# Patient Record
Sex: Male | Born: 1975 | Hispanic: No | Marital: Married | Smoking: Current some day smoker
Health system: Southern US, Community
[De-identification: ages and names within clinical notes are randomized; demographics above are authoritative.]

## PROBLEM LIST (undated history)

## (undated) DIAGNOSIS — K219 Gastro-esophageal reflux disease without esophagitis: Secondary | ICD-10-CM

## (undated) DIAGNOSIS — F419 Anxiety disorder, unspecified: Secondary | ICD-10-CM

## (undated) HISTORY — DX: Gastro-esophageal reflux disease without esophagitis: K21.9

## (undated) HISTORY — DX: Anxiety disorder, unspecified: F41.9

---

## 2008-06-19 ENCOUNTER — Emergency Department (HOSPITAL_COMMUNITY): Admission: EM | Admit: 2008-06-19 | Discharge: 2008-06-20 | Payer: Self-pay | Admitting: Emergency Medicine

## 2008-08-27 ENCOUNTER — Encounter: Admission: RE | Admit: 2008-08-27 | Discharge: 2008-08-27 | Payer: Self-pay | Admitting: Emergency Medicine

## 2009-04-08 ENCOUNTER — Emergency Department (HOSPITAL_COMMUNITY): Admission: EM | Admit: 2009-04-08 | Discharge: 2009-04-09 | Payer: Self-pay | Admitting: Emergency Medicine

## 2009-10-30 ENCOUNTER — Ambulatory Visit (HOSPITAL_COMMUNITY): Admission: RE | Admit: 2009-10-30 | Discharge: 2009-10-30 | Payer: Self-pay | Admitting: Emergency Medicine

## 2010-07-24 LAB — DIFFERENTIAL
Basophils Absolute: 0 10*3/uL (ref 0.0–0.1)
Basophils Relative: 1 % (ref 0–1)
Eosinophils Relative: 1 % (ref 0–5)
Lymphs Abs: 1.4 10*3/uL (ref 0.7–4.0)
Neutrophils Relative %: 69 % (ref 43–77)

## 2010-07-24 LAB — CBC
MCHC: 35.4 g/dL (ref 30.0–36.0)
Platelets: 174 10*3/uL (ref 150–400)
RBC: 4.42 MIL/uL (ref 4.22–5.81)
RDW: 12.4 % (ref 11.5–15.5)

## 2010-07-24 LAB — LIPASE, BLOOD: Lipase: 24 U/L (ref 11–59)

## 2010-07-24 LAB — COMPREHENSIVE METABOLIC PANEL
ALT: 43 U/L (ref 0–53)
AST: 31 U/L (ref 0–37)
Alkaline Phosphatase: 62 U/L (ref 39–117)
BUN: 11 mg/dL (ref 6–23)
CO2: 25 mEq/L (ref 19–32)
Calcium: 9 mg/dL (ref 8.4–10.5)
Potassium: 3.4 mEq/L — ABNORMAL LOW (ref 3.5–5.1)

## 2010-07-24 LAB — HEMOCCULT GUIAC POC 1CARD (OFFICE): Fecal Occult Bld: NEGATIVE

## 2010-07-24 LAB — H. PYLORI ANTIBODY, IGG: H Pylori IgG: 0.7 {ISR}

## 2010-08-08 LAB — POCT I-STAT, CHEM 8
BUN: 7 mg/dL (ref 6–23)
Calcium, Ion: 1.13 mmol/L (ref 1.12–1.32)
Chloride: 106 mEq/L (ref 96–112)
Creatinine, Ser: 0.8 mg/dL (ref 0.4–1.5)
Glucose, Bld: 94 mg/dL (ref 70–99)
Potassium: 3.7 mEq/L (ref 3.5–5.1)

## 2010-08-08 LAB — POCT CARDIAC MARKERS: Troponin i, poc: <0.05 ng/mL (ref 0.00–0.09)

## 2010-08-08 LAB — RAPID URINE DRUG SCREEN, HOSP PERFORMED
Amphetamines: NOT DETECTED
Barbiturates: NOT DETECTED
Opiates: NOT DETECTED

## 2011-05-27 ENCOUNTER — Ambulatory Visit (INDEPENDENT_AMBULATORY_CARE_PROVIDER_SITE_OTHER): Payer: 59 | Admitting: Emergency Medicine

## 2011-05-27 VITALS — BP 104/65 | HR 62 | Temp 98.4°F | Resp 16 | Ht 67.5 in | Wt 171.0 lb

## 2011-05-27 DIAGNOSIS — F411 Generalized anxiety disorder: Secondary | ICD-10-CM

## 2011-05-27 DIAGNOSIS — F419 Anxiety disorder, unspecified: Secondary | ICD-10-CM

## 2011-05-27 DIAGNOSIS — F32A Depression, unspecified: Secondary | ICD-10-CM

## 2011-05-27 DIAGNOSIS — F329 Major depressive disorder, single episode, unspecified: Secondary | ICD-10-CM

## 2011-05-27 NOTE — Progress Notes (Signed)
  Subjective:    Patient ID: Larry Waters, male    DOB: [birth year], 36 y.o.   MRN: [id number]  HPI patient comes to have completion of his FMLA forms. He misses about one day per week to 2 anxiety depression. He overall is doing well his wife is expecting their first child. He is comfortable with the medications he is on and doing well    Review of Systems    review of systems essentially noncontributory. He still suffers from anxiety depression but medication seemed to help a lot to make him more functional at work. Objective:   Physical Exam HEENT exam is unremarkable chest is clear heart regular rate and rhythm        Assessment & Plan:  Chronic anxiety and depression currently well controlled with medication

## 2011-07-31 ENCOUNTER — Ambulatory Visit (INDEPENDENT_AMBULATORY_CARE_PROVIDER_SITE_OTHER): Payer: 59 | Admitting: Family Medicine

## 2011-07-31 VITALS — BP 109/70 | HR 64 | Temp 98.2°F | Resp 18 | Ht 67.5 in | Wt 170.0 lb

## 2011-07-31 DIAGNOSIS — J069 Acute upper respiratory infection, unspecified: Secondary | ICD-10-CM

## 2011-07-31 DIAGNOSIS — F341 Dysthymic disorder: Secondary | ICD-10-CM

## 2011-07-31 DIAGNOSIS — F419 Anxiety disorder, unspecified: Secondary | ICD-10-CM

## 2011-07-31 MED ORDER — CLONAZEPAM 0.5 MG PO TABS: 0.5000 mg | ORAL_TABLET | ORAL | Status: DC | PRN

## 2011-07-31 MED ORDER — FLUTICASONE PROPIONATE 50 MCG/ACT NA SUSP: 2.0000 | Freq: Every day | NASAL | Status: DC

## 2011-07-31 MED ORDER — CITALOPRAM HYDROBROMIDE 40 MG PO TABS: 40.0000 mg | ORAL_TABLET | Freq: Every day | ORAL | Status: DC

## 2011-07-31 NOTE — Patient Instructions (Signed)
Drink plenty of fluids and get enough rest.  Take over the counter Claritin D once daily to help head congestion  For body aches take acetominophen (tylenol) 500 mg 2 pills 3 time in 24 hrs. If needed

## 2011-07-31 NOTE — Progress Notes (Signed)
Subjective: Patient has been feeling ill since about Friday. He has had facial discomfort and sinus congestion with clear rhinorrhea. He has not had a sore throat. He has had a headache and general bodyaches. He is not coughing. No nausea or vomiting or urinary complaints. He was able to go to work yesterday but just doesn't feel good. No one else in the home is ill.  Patient is not currently taking his medications because his prescriptions are out. He needs a new prescription for the citalopram and clonazepam. The medications do help him considerably when he takes them.  Objective: [place] male in no acute distress. TMs normal. Throat clear. Nose congested. Neck supple without significant nodes. Chest clear. Heart regular without murmurs. Soft without masses or tenderness.  . Assessment:  URI Anxiety and depression  Pain: Claritin-D OTC Flonase 2 sprays twice a day for 3 days then daily Refill citalopram and clonazepam Return in about 3-4 months.

## 2011-08-14 ENCOUNTER — Ambulatory Visit: Payer: 59

## 2011-08-14 ENCOUNTER — Ambulatory Visit (INDEPENDENT_AMBULATORY_CARE_PROVIDER_SITE_OTHER): Payer: 59 | Admitting: Family Medicine

## 2011-08-14 VITALS — BP 120/77 | HR 73 | Temp 98.1°F | Resp 18 | Ht 68.0 in | Wt 171.0 lb

## 2011-08-14 DIAGNOSIS — M25569 Pain in unspecified knee: Secondary | ICD-10-CM

## 2011-08-14 DIAGNOSIS — M542 Cervicalgia: Secondary | ICD-10-CM

## 2011-08-14 DIAGNOSIS — M79609 Pain in unspecified limb: Secondary | ICD-10-CM

## 2011-08-14 DIAGNOSIS — T148XXA Other injury of unspecified body region, initial encounter: Secondary | ICD-10-CM

## 2011-08-14 DIAGNOSIS — M62838 Other muscle spasm: Secondary | ICD-10-CM

## 2011-08-14 MED ORDER — NAPROXEN 500 MG PO TABS: 500.0000 mg | ORAL_TABLET | Freq: Two times a day (BID) | ORAL | Status: AC

## 2011-08-14 MED ORDER — CYCLOBENZAPRINE HCL 5 MG PO TABS: 5.0000 mg | ORAL_TABLET | Freq: Two times a day (BID) | ORAL | Status: AC | PRN
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
## 2011-12-11 ENCOUNTER — Ambulatory Visit (INDEPENDENT_AMBULATORY_CARE_PROVIDER_SITE_OTHER): Payer: 59 | Admitting: Family Medicine

## 2011-12-11 VITALS — BP 97/59 | HR 63 | Temp 98.4°F | Resp 14 | Ht 68.5 in | Wt 166.0 lb

## 2011-12-11 DIAGNOSIS — R590 Localized enlarged lymph nodes: Secondary | ICD-10-CM

## 2011-12-11 DIAGNOSIS — H9209 Otalgia, unspecified ear: Secondary | ICD-10-CM

## 2011-12-11 DIAGNOSIS — B349 Viral infection, unspecified: Secondary | ICD-10-CM

## 2011-12-11 DIAGNOSIS — H9203 Otalgia, bilateral: Secondary | ICD-10-CM

## 2011-12-11 DIAGNOSIS — R599 Enlarged lymph nodes, unspecified: Secondary | ICD-10-CM

## 2011-12-11 LAB — POCT CBC
Granulocyte percent: 57.7 %G (ref 37–80)
Hemoglobin: 15.6 g/dL (ref 14.1–18.1)
MCHC: 32 g/dL (ref 31.8–35.4)
MCV: 97.1 fL — AB (ref 80–97)
POC Granulocyte: 4.4 (ref 2–6.9)
POC LYMPH PERCENT: 34.3 %L (ref 10–50)
POC MID %: 8 %M (ref 0–12)
RBC: 5.03 M/uL (ref 4.69–6.13)
WBC: 7.7 10*3/uL (ref 4.6–10.2)

## 2011-12-11 MED ORDER — PREDNISONE 20 MG PO TABS: ORAL_TABLET | ORAL | Status: AC

## 2011-12-11 NOTE — Patient Instructions (Addendum)
Take the pills one tablet twice daily for 3 days with food  Return if worse

## 2011-12-11 NOTE — Progress Notes (Signed)
Subjective: Patient has had several days of pain in his years and in his throat. He actually points to the sides of his neck in the submandibular areas are most of the pain is coming from. 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 They feel like this may cause some problems.  His wife serves as Equities trader.  Objective: TMs are normal. Throat was clear. Neck supple without thyromegaly. Actually maybe there are minimally enlarged nodes under the angle of the jaw, but certainly is tender right along they're both sides. Chest is clear. Heart regular without murmurs.  Assessment: Lymphadenopathy Otalgia  Plan: CBC Results for orders placed in visit on 12/11/11  POCT CBC      Component Value Range   WBC 7.7  4.6 - 10.2 K/uL   Lymph, poc 2.6  0.6 - 3.4   POC LYMPH PERCENT 34.3  10 - 50 %L   MID (cbc) 0.6  0 - 0.9   POC MID % 8.0  0 - 12 %M   POC Granulocyte 4.4  2 - 6.9   Granulocyte percent 57.7  37 - 80 %G   RBC 5.03  4.69 - 6.13 M/uL   Hemoglobin 15.6  14.1 - 18.1 g/dL   HCT, POC 45.4  09.8 - 53.7 %   MCV 97.1 (*) 80 - 97 fL   MCH, POC 31.0  27 - 31.2 pg   MCHC 32.0  31.8 - 35.4 g/dL   RDW, POC 11.9     Platelet Count, POC 237  142 - 424 K/uL   MPV 8.9  0 - 99.8 fL   With this being normal will probably just treat symptomatically. If he continues to have problems I may empirically give a round of antibiotics, but is more likely garlic this time.

## 2012-05-19 ENCOUNTER — Ambulatory Visit: Payer: 59 | Admitting: Family Medicine

## 2012-05-19 VITALS — BP 110/73 | HR 78 | Temp 97.7°F | Resp 16 | Ht 67.8 in | Wt 167.4 lb

## 2012-05-19 DIAGNOSIS — F341 Dysthymic disorder: Secondary | ICD-10-CM

## 2012-05-19 DIAGNOSIS — F419 Anxiety disorder, unspecified: Secondary | ICD-10-CM

## 2012-05-19 DIAGNOSIS — M542 Cervicalgia: Secondary | ICD-10-CM

## 2012-05-19 DIAGNOSIS — F329 Major depressive disorder, single episode, unspecified: Secondary | ICD-10-CM

## 2012-05-19 MED ORDER — METHOCARBAMOL 500 MG PO TABS: ORAL_TABLET | ORAL | Status: DC

## 2012-05-19 MED ORDER — CLONAZEPAM 0.5 MG PO TABS: ORAL_TABLET | ORAL | Status: DC

## 2012-05-19 MED ORDER — CITALOPRAM HYDROBROMIDE 20 MG PO TABS: 30.0000 mg | ORAL_TABLET | Freq: Every day | ORAL | Status: DC

## 2012-05-19 NOTE — Patient Instructions (Signed)
Take muscle relaxants for your neck as directed.  The citalopram is a lower dose than previously. Take 1-1/2 pills every day for anxiety on a regular basis.  The clonazepam should only be used when needed for bad anxiety

## 2012-05-19 NOTE — Progress Notes (Signed)
Subjective: Patient been having problems with pain in his neck and he wants something for that. No injury. It's been hurting him for a week to. He is other problem is his chronic anxiety. It contained much same. He thinks he maybe could do are little lower dose of Celexa. His wife is with him. He needs her to translate. He works at [employer], has to pack and lift heavy boxes a lot. He needs FMLA paperwork filled out again. He misses work a day here a day Advair, sometimes several times a month, for his extreme anxiety.  Objective:  alert and oriented has full range of motion. Chest clear. Heart regular without murmurs. And soft without mass or tenderness. Motor strength is good. Neurovascular palms is good.  Assessment: Right neck pain Anxiety and depression, chronic  Plan: Filled out FMLA paperwork for him and his wife f says she has to come out whenever he is off.  Decrease his Celexa to 30 mg. 1-1/2x20 mg. Used the clonazepam on a when necessary basis, a needed. Return in 6 months.  Prescribe muscle relaxant for his neck. Did not give him NSAIDs because he has stomach issues.

## 2012-07-24 ENCOUNTER — Ambulatory Visit (INDEPENDENT_AMBULATORY_CARE_PROVIDER_SITE_OTHER): Payer: 59 | Admitting: Emergency Medicine

## 2012-07-24 VITALS — BP 118/76 | HR 68 | Temp 98.0°F | Resp 16 | Ht 67.75 in | Wt 167.0 lb

## 2012-07-24 DIAGNOSIS — R300 Dysuria: Secondary | ICD-10-CM

## 2012-07-24 DIAGNOSIS — R109 Unspecified abdominal pain: Secondary | ICD-10-CM

## 2012-07-24 LAB — POCT URINALYSIS DIPSTICK
Blood, UA: NEGATIVE
Glucose, UA: NEGATIVE
Spec Grav, UA: 1.025
pH, UA: 6

## 2012-07-24 LAB — POCT CBC
HCT, POC: 47.3 % (ref 43.5–53.7)
Hemoglobin: 15.7 g/dL (ref 14.1–18.1)
MCH, POC: 31.5 pg — AB (ref 27–31.2)
MCV: 95 fL (ref 80–97)
MID (cbc): 0.6 (ref 0–0.9)
Platelet Count, POC: 216 10*3/uL (ref 142–424)
RBC: 4.98 M/uL (ref 4.69–6.13)
WBC: 8.3 10*3/uL (ref 4.6–10.2)

## 2012-07-24 LAB — POCT UA - MICROSCOPIC ONLY
Epithelial cells, urine per micros: NEGATIVE
RBC, urine, microscopic: NEGATIVE

## 2012-07-24 MED ORDER — CEPHALEXIN 500 MG PO TABS: 500.0000 mg | ORAL_TABLET | Freq: Three times a day (TID) | ORAL | Status: DC

## 2012-07-24 NOTE — Progress Notes (Signed)
  Subjective:    Patient ID: Larry Waters, male    DOB: Jun 29, 1975, 37 y.o.   MRN: [id number]  HPI states he woke up about 4 AM with low abdominal discomfort associated with burning on urination. He has no history of problems. With his prostate or his colon. He typically has a bowel movement every day. He denies any urinary discharge.    Review of Systems he has a history of anxiety depression and is currently on medication for this and doing well he's been well and gaining weight.     Objective:   Physical Exam patient is alert and cooperative does not appear ill he is afebrile at the present time his neck is supple. His chest is clear to auscultation and percussion. His cardiac exam reveals a regular rate without murmurs rubs or gallops abdominal exam reveals mild suprapubic and left lower abdominal pain. Results for orders placed in visit on 07/24/12  POCT CBC      Result Value Range   WBC 8.3  4.6 - 10.2 K/uL   Lymph, poc 2.2  0.6 - 3.4   POC LYMPH PERCENT 26.1  10 - 50 %L   MID (cbc) 0.6  0 - 0.9   POC MID % 7.7  0 - 12 %M   POC Granulocyte 5.5  2 - 6.9   Granulocyte percent 66.2  37 - 80 %G   RBC 4.98  4.69 - 6.13 M/uL   Hemoglobin 15.7  14.1 - 18.1 g/dL   HCT, POC 14.7  82.9 - 53.7 %   MCV 95.0  80 - 97 fL   MCH, POC 31.5 (*) 27 - 31.2 pg   MCHC 33.2  31.8 - 35.4 g/dL   RDW, POC 56.2     Platelet Count, POC 216  142 - 424 K/uL   MPV 9.2  0 - 99.8 fL  IFOBT (OCCULT BLOOD)      Result Value Range   IFOBT Negative    POCT UA - MICROSCOPIC ONLY      Result Value Range   WBC, Ur, HPF, POC 0-2     RBC, urine, microscopic neg     Bacteria, U Microscopic 2+     Mucus, UA mod     Epithelial cells, urine per micros neg     Crystals, Ur, HPF, POC calcium oxalate     Casts, Ur, LPF, POC neg     Yeast, UA neg     Renal tubular cells pos    POCT URINALYSIS DIPSTICK      Result Value Range   Color, UA Dk yellow     Clarity, UA clear     Glucose, UA neg     Bilirubin, UA small      Ketones, UA trace     Spec Grav, UA 1.025     Blood, UA neg     pH, UA 6.0     Protein, UA 30     Urobilinogen, UA 0.2     Nitrite, UA neg     Leukocytes, UA Negative           Assessment & Plan:  Will check a urine, URiprobe, urine culture, and CBC along with a hemosure. She did have 2+ bacteria in his urine with moderate mucus we'll go ahead and treat with antibiotics pending urine culture and URiprobe which were both collected today. Will treat with cephalexin  pending culture results.

## 2012-07-25 ENCOUNTER — Telehealth: Payer: Self-pay

## 2012-07-25 LAB — URINE CULTURE: Organism ID, Bacteria: NO GROWTH

## 2012-07-25 LAB — GC/CHLAMYDIA PROBE AMP
CT Probe RNA: NEGATIVE
GC Probe RNA: NEGATIVE

## 2012-07-25 NOTE — Telephone Encounter (Signed)
Patient was here yesterday and now is still feeling bad would like a nurse to call back please [phone]

## 2012-07-25 NOTE — Telephone Encounter (Signed)
Called, left message for him to call me back. If he is worse, he should come back in.

## 2012-07-26 NOTE — Telephone Encounter (Signed)
Advise pt to RTC. Pt still feeling the same. Pt labs are normal.

## 2012-07-27 ENCOUNTER — Ambulatory Visit: Payer: 59 | Admitting: Family Medicine

## 2012-07-27 VITALS — BP 110/72 | HR 70 | Temp 97.9°F | Resp 16 | Ht 67.75 in | Wt 167.0 lb

## 2012-07-27 DIAGNOSIS — K5900 Constipation, unspecified: Secondary | ICD-10-CM

## 2012-07-27 DIAGNOSIS — R300 Dysuria: Secondary | ICD-10-CM

## 2012-07-27 NOTE — Progress Notes (Deleted)
  Subjective:    Patient ID: Larry Waters, male    DOB: 16-Jul-1975, 37 y.o.   MRN: [id number]  HPI    Review of Systems     Objective:   Physical Exam        Assessment & Plan:

## 2012-07-27 NOTE — Progress Notes (Signed)
Subjective: 37 year old man who is in here 2 days ago and treated for a dysuria and possible UTI. He was told to take the antibiotic 3 times daily, but he misunderstood and only took it once daily. He continues to have pain in his low abdomen and in his distal penis with urination. He does have hard stools, even though they moved leg movements great difficulty.  Objective: Normal bowel sounds no CVA tenderness. Abdomen was soft. Tender in the left lower cord. And across the suprapubic region. Mild tenderness in the right lower quadrant. Normal male external genitalia. No hernias noted.  Assessment: Low abdominal pain secondary to constipation Dysuria  Plan: Take the antibiotic as ordered by Dr. Cleta Alberts. Drink lots of fluids. Take MiraLax Return if worse or not improving.

## 2012-07-27 NOTE — Patient Instructions (Signed)
Drink lots of water  Take MiraLax one dose daily. You can buy this over-the-counter. If the stools get loose, stopped taking the MiraLax and only use it when necessary.  Take the antibiotics 3 times daily as ordered   Constipation, Adult Constipation is when a person has fewer than 3 bowel movements a week; has difficulty having a bowel movement; or has stools that are dry, hard, or larger than normal. As people grow older, constipation is more common. If you try to fix constipation with medicines that make you have a bowel movement (laxatives), the problem may get worse. Long-term laxative use may cause the muscles of the colon to become weak. A low-fiber diet, not taking in enough fluids, and taking certain medicines may make constipation worse. CAUSES   Certain medicines, such as antidepressants, pain medicine, iron supplements, antacids, and water pills.   Certain diseases, such as diabetes, irritable bowel syndrome (IBS), thyroid disease, or depression.   Not drinking enough water.   Not eating enough fiber-rich foods.   Stress or travel.  Lack of physical activity or exercise.  Not going to the restroom when there is the urge to have a bowel movement.  Ignoring the urge to have a bowel movement.  Using laxatives too much. SYMPTOMS   Having fewer than 3 bowel movements a week.   Straining to have a bowel movement.   Having hard, dry, or larger than normal stools.   Feeling full or bloated.   Pain in the lower abdomen.  Not feeling relief after having a bowel movement. DIAGNOSIS  Your caregiver will take a medical history and perform a physical exam. Further testing may be done for severe constipation. Some tests may include:   A barium enema X-ray to examine your rectum, colon, and sometimes, your small intestine.  A sigmoidoscopy to examine your lower colon.  A colonoscopy to examine your entire colon. TREATMENT  Treatment will depend on the severity of  your constipation and what is causing it. Some dietary treatments include drinking more fluids and eating more fiber-rich foods. Lifestyle treatments may include regular exercise. If these diet and lifestyle recommendations do not help, your caregiver may recommend taking over-the-counter laxative medicines to help you have bowel movements. Prescription medicines may be prescribed if over-the-counter medicines do not work.  HOME CARE INSTRUCTIONS   Increase dietary fiber in your diet, such as fruits, vegetables, whole grains, and beans. Limit high-fat and processed sugars in your diet, such as [place] fries, hamburgers, cookies, candies, and soda.   A fiber supplement may be added to your diet if you cannot get enough fiber from foods.   Drink enough fluids to keep your urine clear or pale yellow.   Exercise regularly or as directed by your caregiver.   Go to the restroom when you have the urge to go. Do not hold it.  Only take medicines as directed by your caregiver. Do not take other medicines for constipation without talking to your caregiver first. SEEK IMMEDIATE MEDICAL CARE IF:   You have bright red blood in your stool.   Your constipation lasts for more than 4 days or gets worse.   You have abdominal or rectal pain.   You have thin, pencil-like stools.  You have unexplained weight loss. MAKE SURE YOU:   Understand these instructions.  Will watch your condition.  Will get help right away if you are not doing well or get worse. Document Released: 01/06/2004 Document Revised: 07/02/2011 Document Reviewed: 03/13/2011 Exit[employer]  Patient Information 2013 [employer].  

## 2012-07-28 ENCOUNTER — Telehealth: Payer: Self-pay | Admitting: Radiology

## 2012-07-28 NOTE — Telephone Encounter (Signed)
Patient requested work notes for days missed, these are provided, given to patient.

## 2012-12-02 ENCOUNTER — Encounter: Payer: Self-pay | Admitting: Family Medicine

## 2013-04-27 ENCOUNTER — Other Ambulatory Visit: Payer: Self-pay | Admitting: Family Medicine

## 2013-08-14 ENCOUNTER — Encounter (HOSPITAL_BASED_OUTPATIENT_CLINIC_OR_DEPARTMENT_OTHER): Payer: Self-pay | Admitting: Emergency Medicine

## 2013-08-14 ENCOUNTER — Emergency Department (HOSPITAL_BASED_OUTPATIENT_CLINIC_OR_DEPARTMENT_OTHER)
Admission: EM | Admit: 2013-08-14 | Discharge: 2013-08-14 | Disposition: A | Discharge status: Home / Self Care | Payer: 59 | Attending: Emergency Medicine | Admitting: Emergency Medicine

## 2013-08-14 ENCOUNTER — Emergency Department (HOSPITAL_BASED_OUTPATIENT_CLINIC_OR_DEPARTMENT_OTHER): Payer: 59

## 2013-08-14 DIAGNOSIS — H6690 Otitis media, unspecified, unspecified ear: Secondary | ICD-10-CM | POA: Insufficient documentation

## 2013-08-14 DIAGNOSIS — F17200 Nicotine dependence, unspecified, uncomplicated: Secondary | ICD-10-CM | POA: Insufficient documentation

## 2013-08-14 DIAGNOSIS — K219 Gastro-esophageal reflux disease without esophagitis: Secondary | ICD-10-CM | POA: Insufficient documentation

## 2013-08-14 DIAGNOSIS — H7290 Unspecified perforation of tympanic membrane, unspecified ear: Secondary | ICD-10-CM | POA: Insufficient documentation

## 2013-08-14 DIAGNOSIS — IMO0002 Reserved for concepts with insufficient information to code with codable children: Secondary | ICD-10-CM | POA: Insufficient documentation

## 2013-08-14 DIAGNOSIS — R42 Dizziness and giddiness: Secondary | ICD-10-CM | POA: Insufficient documentation

## 2013-08-14 DIAGNOSIS — F411 Generalized anxiety disorder: Secondary | ICD-10-CM | POA: Insufficient documentation

## 2013-08-14 DIAGNOSIS — Z792 Long term (current) use of antibiotics: Secondary | ICD-10-CM | POA: Insufficient documentation

## 2013-08-14 MED ORDER — AMOXICILLIN 500 MG PO CAPS
500.0000 mg | ORAL_CAPSULE | Freq: Once | ORAL | Status: AC
  Administered 2013-08-14: 500 mg via ORAL
  Filled 2013-08-14: qty 1

## 2013-08-14 MED ORDER — AMOXICILLIN 500 MG PO CAPS: 500.0000 mg | ORAL_CAPSULE | Freq: Three times a day (TID) | ORAL | Status: DC

## 2013-08-14 MED ORDER — MECLIZINE HCL 25 MG PO TABS
25.0000 mg | ORAL_TABLET | Freq: Once | ORAL | Status: AC
  Administered 2013-08-14: 25 mg via ORAL
  Filled 2013-08-14: qty 1

## 2013-08-14 MED ORDER — MECLIZINE HCL 25 MG PO TABS: 25.0000 mg | ORAL_TABLET | Freq: Three times a day (TID) | ORAL | Status: DC | PRN

## 2013-08-14 NOTE — ED Notes (Signed)
Pt ambulating independently w/ steady gait on d/c in no acute distress, A&Ox4. D/c instructions reviewed w/ pt and family - pt and family deny any further questions or concerns at present. Rx given x2  

## 2013-08-14 NOTE — ED Notes (Signed)
Sudden onset pain in his right ear x 30 minutes. Bloody drainage from his ear and neck pain.

## 2013-08-14 NOTE — ED Provider Notes (Signed)
CSN: [id number]     Arrival date & time 08/14/13  2016 History  This chart was scribed for Charles B. Bernette MayersSheldon, MD by Elveria Risingimelie Horne, ED scribe.  This patient was seen in room MH06/MH06 and the patient's care was started at 9:01 PM.   Chief Complaint  Patient presents with  . Otalgia      The history is provided by the patient. The history is limited by a language barrier. A language interpreter was used (family member, pt refused telephone interpreter).   HPI Comments: Larry Waters is a 38 y.o. male who presents to the Emergency Department complaining of right ear pain with associated bloody drainage. Patient states that tonight he felt like something was in his ear so attempted to clean it with a Q-tip. The q-tip was stained with blood and after cleaning his ear it began bleeding.  Patient reports that the ear has been bothering him for a while now. For the last few weeks patient has been complaining of pressure in his head and vertigo when lying down. Patient denies fever, cough, or nasal congestion.  Patient reports high blood pressure, but endorses that he is otherwise healthy.   Past Medical History  Diagnosis Date  . GERD (gastroesophageal reflux disease)   . Anxiety    History reviewed. No pertinent past surgical history. No family history on file. History  Substance Use Topics  . Smoking status: Current Some Day Smoker -- 15 years    Types: Cigarettes  . Smokeless tobacco: Not on file  . Alcohol Use: Yes     Comment: sometimes    Review of Systems A complete 10 system review of systems was obtained and all systems are negative except as noted in the HPI and PMH.     Allergies  Review of patient's allergies indicates no known allergies.  Home Medications   Prior to Admission medications   Medication Sig Start Date End Date Taking? Authorizing Provider  Cephalexin 500 MG tablet Take 1 tablet (500 mg total) by mouth 3 (three) times daily. 07/24/12   Collene GobbleSteven A Daub, MD   citalopram (CELEXA) 20 MG tablet PT NEEDS APPT FOR FURTHER REFILLS. 04/28/13   Godfrey PickEleanore E Egan, PA-C  clonazePAM (KLONOPIN) 0.5 MG tablet Take one half to one tablet twice daily only if needed for extreme anxiety 05/19/12   Peyton Najjaravid H Hopper, MD  fluticasone (FLONASE) 50 MCG/ACT nasal spray Place 2 sprays into the nose daily. 07/31/11 07/30/12  Peyton Najjaravid H Hopper, MD  methocarbamol (ROBAXIN) 500 MG tablet Take one in the morning one in the afternoon and 2 at bedtime for muscle relaxant for neck 05/19/12   Peyton Najjaravid H Hopper, MD  ranitidine (ZANTAC) 150 MG tablet Take 150 mg by mouth as needed.    Historical Provider, MD   Triage Vitals: BP 148/96  Pulse 88  Temp(Src) 97.6 F (36.4 C) (Oral)  Resp 18  Ht 5\' 7"  (1.702 m)  Wt 175 lb (79.379 kg)  BMI 27.40 kg/m2  SpO2 98% Physical Exam  Nursing note and vitals reviewed. Constitutional: He is oriented to person, place, and time. He appears well-developed and well-nourished.  HENT:  Head: Normocephalic and atraumatic.  Left Ear: Tympanic membrane and external ear normal.  R canal with some blood, TM partially obscured but looks to have pus, difficult to ascertain TM perforation  Eyes: EOM are normal. Pupils are equal, round, and reactive to light.  Neck: Normal range of motion. Neck supple.  Cardiovascular: Normal rate, normal heart sounds  and intact distal pulses.   Pulmonary/Chest: Effort normal and breath sounds normal.  Abdominal: Bowel sounds are normal. He exhibits no distension. There is no tenderness.  Musculoskeletal: Normal range of motion. He exhibits no edema and no tenderness.  Neurological: He is alert and oriented to person, place, and time. He has normal strength. No cranial nerve deficit or sensory deficit.  Skin: Skin is warm and dry. No rash noted.  Psychiatric: He has a normal mood and affect.    ED Course  Procedures (including critical care time) DIAGNOSTIC STUDIES: Oxygen Saturation is 98% on room air, normal by my interpretation.     COORDINATION OF CARE: 9:09 PM- Will order CT of head and refer to ear specialist for further treatment. Discussed treatment plan with patient at bedside and patient agreed to plan.     Labs Review Labs Reviewed - No data to display  Imaging Review Ct Temporal Bones W/o Cm  08/14/2013   CLINICAL DATA:  Sudden onset right ear pain. Bloody drainage. Evaluate for mastoiditis.  EXAM: CT TEMPORAL BONES WITHOUT CONTRAST  TECHNIQUE: Axial and coronal plane CT imaging of the petrous temporal bones was performed with thin-collimation image reconstruction. No intravenous contrast was administered. Multiplanar CT image reconstructions were also generated.  COMPARISON:  Head CT 10/30/2009  FINDINGS: RIGHT: There is soft tissue thickening/ debris in the external auditory canal. Soft tissue abuts the tympanic membrane in the medial aspect of the canal. There is no evidence of osseous erosion involving the bony external auditory canal. The ossicles appear intact. The tympanic cavity is clear. The scutum is sharp. Prussak's space is clear. The internal auditory canal, cochlea, vestibule, and semicircular canals are unremarkable. The mastoid air cells are clear.  LEFT: The external auditory canal and tympanic membrane are unremarkable. The ossicles appear intact. The tympanic cavity is clear. The internal auditory canal, cochlea, vestibule, and semicircular canals are unremarkable. The mastoid air cells are clear.  The visualized paranasal sinuses are clear. The visualized portion of the brain is unremarkable. Visualized orbits are unremarkable.  IMPRESSION: 1. Clear mastoids.  No evidence of mastoiditis. 2. Soft tissue in the medial aspect of the right external auditory canal abutting the tympanic membrane. No evidence of osseous erosion. Clear tympanic cavity. 3. Unremarkable appearance of the left temporal bone.   Electronically Signed   By: Sebastian AcheAllen  Grady   On: 08/14/2013 21:55     EKG Interpretation None       MDM   Final diagnoses:  Otitis media  Perforated tympanic membrane  Vertigo    CT neg for mastoiditis. Suspect OM with TM rupture and vertigo recently. Abx, meclizine and ENT followup.   I personally performed the services described in this documentation, which was scribed in my presence. The recorded information has been reviewed and is accurate.      Charles B. Bernette MayersSheldon, MD 08/14/13 2259

## 2013-08-14 NOTE — Discharge Instructions (Signed)
Eardrum Perforation The eardrum is a thin, round tissue inside the ear that separates the ear canal from the middle ear. This is the tissue that detects sound and enables you to hear. The eardrum can be punctured or torn (perforated). Eardrums generally heal without help and with little or no permanent hearing loss. CAUSES   Sudden pressure changes that happen in situations like scuba diving or flying in an airplane.  Foreign objects in the ear.  Inserting a cotton-tipped swab in the ear.  Loud noise.  Trauma to the ear. SYMPTOMS   Hearing loss.  Ear pain.  Ringing in the ears.  Discharge or bleeding from the ear.  Dizziness.  Vomiting.  Facial paralysis. HOME CARE INSTRUCTIONS   Keep your ear dry, as this improves healing. Swimming, diving, and showers are not allowed until healing is complete. While bathing, protect the ear by placing a piece of cotton covered with petroleum jelly in the outer ear canal.  Only take over-the-counter or prescription medicines for pain, discomfort, or fever as directed by your caregiver.  Blow your nose gently. Forceful blowing increases the pressure in the middle ear and may cause further injury or delay healing.  Resume normal activities, such as showering, when the perforation has healed. Your caregiver can let you know when this has occurred.  Talk to your caregiver before flying on an airplane. Air travel is generally allowed with a perforated eardrum.  If your caregiver has given you a follow-up appointment, it is very important to keep that appointment. Failure to keep the appointment could result in a chronic or permanent injury, pain, hearing loss, and disability. SEEK IMMEDIATE MEDICAL CARE IF:   You have bleeding or pus coming from your ear.  You have problems with balance, dizziness, nausea, or vomiting.  You develop increased pain.  You have a fever. MAKE SURE YOU:   Understand these instructions.  Will watch your  condition.  Will get help right away if you are not doing well or get worse. Document Released: 04/06/2000 Document Revised: 07/02/2011 Document Reviewed: 04/08/2008 [facility] Patient Information 2014 [place], [place].  Otitis Media, Adult Otitis media is redness, soreness, and swelling (inflammation) of the middle ear. Otitis media may be caused by allergies or, most commonly, by infection. Often it occurs as a complication of the common cold. SIGNS AND SYMPTOMS Symptoms of otitis media may include:  Earache.  Fever.  Ringing in your ear.  Headache.  Leakage of fluid from the ear. DIAGNOSIS To diagnose otitis media, your health care provider will examine your ear with an otoscope. This is an instrument that allows your health care provider to see into your ear in order to examine your eardrum. Your health care provider also will ask you questions about your symptoms. TREATMENT  Typically, otitis media resolves on its own within 3 5 days. Your health care provider may prescribe medicine to ease your symptoms of pain. If otitis media does not resolve within 5 days or is recurrent, your health care provider may prescribe antibiotic medicines if he or she suspects that a bacterial infection is the cause. HOME CARE INSTRUCTIONS   Take your medicine as directed until it is gone, even if you feel better after the first few days.  Only take over-the-counter or prescription medicines for pain, discomfort, or fever as directed by your health care provider.  Follow up with your health care provider as directed. SEEK MEDICAL CARE IF:  You have otitis media only in one ear or  bleeding from your nose or both.  You notice a lump on your neck.  You are not getting better in 3 5 days.  You feel worse instead of better. SEEK IMMEDIATE MEDICAL CARE IF:   You have pain that is not controlled with medicine.  You have swelling, redness, or pain around your ear or stiffness in your neck.  You  notice that part of your face is paralyzed.  You notice that the bone behind your ear (mastoid) is tender when you touch it. MAKE SURE YOU:   Understand these instructions.  Will watch your condition.  Will get help right away if you are not doing well or get worse. Document Released: 01/13/2004 Document Revised: 01/28/2013 Document Reviewed: 11/04/2012 [facility] Patient Information 2014 [place], [place].  Vertigo Vertigo means you feel like you or your surroundings are moving when they are not. Vertigo can be dangerous if it occurs when you are at work, driving, or performing difficult activities.  CAUSES  Vertigo occurs when there is a conflict of signals sent to your brain from the visual and sensory systems in your body. There are many different causes of vertigo, including:  Infections, especially in the inner ear.  A bad reaction to a drug or misuse of alcohol and medicines.  Withdrawal from drugs or alcohol.  Rapidly changing positions, such as lying down or rolling over in bed.  A migraine headache.  Decreased blood flow to the brain.  Increased pressure in the brain from a head injury, infection, tumor, or bleeding. SYMPTOMS  You may feel as though the world is spinning around or you are falling to the ground. Because your balance is upset, vertigo can cause nausea and vomiting. You may have involuntary eye movements (nystagmus). DIAGNOSIS  Vertigo is usually diagnosed by physical exam. If the cause of your vertigo is unknown, your caregiver may perform imaging tests, such as an MRI scan (magnetic resonance imaging). TREATMENT  Most cases of vertigo resolve on their own, without treatment. Depending on the cause, your caregiver may prescribe certain medicines. If your vertigo is related to body position issues, your caregiver may recommend movements or procedures to correct the problem. In rare cases, if your vertigo is caused by certain inner ear problems, you may need  surgery. HOME CARE INSTRUCTIONS   Follow your caregiver's instructions.  Avoid driving.  Avoid operating heavy machinery.  Avoid performing any tasks that would be dangerous to you or others during a vertigo episode.  Tell your caregiver if you notice that certain medicines seem to be causing your vertigo. Some of the medicines used to treat vertigo episodes can actually make them worse in some people. SEEK IMMEDIATE MEDICAL CARE IF:   Your medicines do not relieve your vertigo or are making it worse.  You develop problems with talking, walking, weakness, or using your arms, hands, or legs.  You develop severe headaches.  Your nausea or vomiting continues or gets worse.  You develop visual changes.  A family member notices behavioral changes.  Your condition gets worse. MAKE SURE YOU:  Understand these instructions.  Will watch your condition.  Will get help right away if you are not doing well or get worse. Document Released: 01/17/2005 Document Revised: 07/02/2011 Document Reviewed: 10/26/2010 [facility]Exit[facility] Patient Information 2014 [place]ExitCare, [place].

## 2015-04-12 DIAGNOSIS — Z0000 Encounter for general adult medical examination without abnormal findings: Secondary | ICD-10-CM | POA: Insufficient documentation

## 2015-04-12 DIAGNOSIS — F411 Generalized anxiety disorder: Secondary | ICD-10-CM | POA: Insufficient documentation

## 2015-04-12 DIAGNOSIS — E785 Hyperlipidemia, unspecified: Secondary | ICD-10-CM | POA: Insufficient documentation

## 2016-06-01 DIAGNOSIS — E785 Hyperlipidemia, unspecified: Secondary | ICD-10-CM | POA: Insufficient documentation

## 2016-06-01 DIAGNOSIS — F17200 Nicotine dependence, unspecified, uncomplicated: Secondary | ICD-10-CM | POA: Insufficient documentation

## 2016-07-28 ENCOUNTER — Emergency Department (HOSPITAL_BASED_OUTPATIENT_CLINIC_OR_DEPARTMENT_OTHER): Payer: 59

## 2016-07-28 ENCOUNTER — Emergency Department (HOSPITAL_BASED_OUTPATIENT_CLINIC_OR_DEPARTMENT_OTHER)
Admission: EM | Admit: 2016-07-28 | Discharge: 2016-07-28 | Disposition: A | Discharge status: Home / Self Care | Payer: 59 | Attending: Emergency Medicine | Admitting: Emergency Medicine

## 2016-07-28 ENCOUNTER — Other Ambulatory Visit: Payer: Self-pay

## 2016-07-28 ENCOUNTER — Encounter (HOSPITAL_BASED_OUTPATIENT_CLINIC_OR_DEPARTMENT_OTHER): Payer: Self-pay | Admitting: *Deleted

## 2016-07-28 DIAGNOSIS — K5900 Constipation, unspecified: Secondary | ICD-10-CM | POA: Insufficient documentation

## 2016-07-28 DIAGNOSIS — F17210 Nicotine dependence, cigarettes, uncomplicated: Secondary | ICD-10-CM | POA: Insufficient documentation

## 2016-07-28 DIAGNOSIS — R1031 Right lower quadrant pain: Secondary | ICD-10-CM | POA: Diagnosis present

## 2016-07-28 LAB — BASIC METABOLIC PANEL
ANION GAP: 8 (ref 5–15)
BUN: 11 mg/dL (ref 6–20)
CALCIUM: 9.4 mg/dL (ref 8.9–10.3)
CO2: 25 mmol/L (ref 22–32)
Chloride: 105 mmol/L (ref 101–111)
Creatinine, Ser: 0.75 mg/dL (ref 0.61–1.24)
GFR calc Af Amer: >60 mL/min (ref >60)
GLUCOSE: 125 mg/dL — AB (ref 65–99)
POTASSIUM: 3.4 mmol/L — AB (ref 3.5–5.1)
Sodium: 138 mmol/L (ref 135–145)

## 2016-07-28 LAB — CBC WITH DIFFERENTIAL/PLATELET
BASOS ABS: 0 10*3/uL (ref 0.0–0.1)
Basophils Relative: 0 %
Eosinophils Absolute: 0.2 10*3/uL (ref 0.0–0.7)
Eosinophils Relative: 2 %
HEMATOCRIT: 42.9 % (ref 39.0–52.0)
Hemoglobin: 15.1 g/dL (ref 13.0–17.0)
Lymphocytes Relative: 36 %
Lymphs Abs: 3.2 10*3/uL (ref 0.7–4.0)
MCH: 32.3 pg (ref 26.0–34.0)
MCHC: 35.2 g/dL (ref 30.0–36.0)
MCV: 91.9 fL (ref 78.0–100.0)
MONO ABS: 0.7 10*3/uL (ref 0.1–1.0)
Monocytes Relative: 8 %
NEUTROS ABS: 4.9 10*3/uL (ref 1.7–7.7)
Neutrophils Relative %: 54 %
Platelets: 180 10*3/uL (ref 150–400)
RBC: 4.67 MIL/uL (ref 4.22–5.81)
RDW: 12.1 % (ref 11.5–15.5)
WBC: 9 10*3/uL (ref 4.0–10.5)

## 2016-07-28 MED ORDER — KETOROLAC TROMETHAMINE 30 MG/ML IJ SOLN
30.0000 mg | Freq: Once | INTRAMUSCULAR | Status: AC
  Administered 2016-07-28: 30 mg via INTRAVENOUS
  Filled 2016-07-28: qty 1

## 2016-07-28 NOTE — ED Provider Notes (Signed)
[facility]   CSN: [id number] Arrival date & time: 07/28/16  0151     History   Chief Complaint Chief Complaint  Patient presents with  . Abdominal Pain    HPI Larry Waters is a 41 y.o. male.  The history is provided by the patient.  Abdominal Pain   The current episode started yesterday. The problem occurs constantly. The problem has not changed since onset.The pain is associated with an unknown factor. The pain is located in the RLQ. The pain is moderate. Pertinent negatives include anorexia, fever, belching, diarrhea, flatus, melena, nausea, vomiting, dysuria, frequency, hematuria, headaches, arthralgias and myalgias. Nothing aggravates the symptoms. Nothing relieves the symptoms. Past workup does not include surgery. His past medical history does not include ulcerative colitis.  Seen at urgent care and told he might have a mild UTI.  But pain was still there despite one dose of cipro and his bp was 139 so he came in.    Past Medical History:  Diagnosis Date  . Anxiety   . GERD (gastroesophageal reflux disease)     Patient Active Problem List   Diagnosis Date Noted  . Anxiety 05/27/2011  . Depression 05/27/2011    History reviewed. No pertinent surgical history.     Home Medications    Prior to Admission medications   Medication Sig Start Date End Date Taking? Authorizing Provider  ciprofloxacin (CIPRO) 250 MG tablet Take 250 mg by mouth 2 (two) times daily.   Yes Historical Provider, MD  citalopram (CELEXA) 20 MG tablet PT NEEDS APPT FOR FURTHER REFILLS. 04/28/13   Godfrey Pick, PA-C  ranitidine (ZANTAC) 150 MG tablet Take 150 mg by mouth as needed.    Historical Provider, MD    Family History History reviewed. No pertinent family history.  Social History Social History  Substance Use Topics  . Smoking status: Current Some Day Smoker    Years: 15.00    Types: Cigarettes  . Smokeless tobacco: Never Used  . Alcohol use Yes   Comment: sometimes     Allergies   Patient has no known allergies.   Review of Systems Review of Systems  Constitutional: Negative for diaphoresis and fever.  Respiratory: Negative for cough, chest tightness and shortness of breath.   Cardiovascular: Negative for chest pain, palpitations and leg swelling.  Gastrointestinal: Positive for abdominal pain. Negative for anorexia, blood in stool, diarrhea, flatus, melena, nausea and vomiting.  Genitourinary: Negative for dysuria, frequency and hematuria.  Musculoskeletal: Negative for arthralgias and myalgias.  Neurological: Negative for headaches.  All other systems reviewed and are negative.    Physical Exam Updated Vital Signs BP (!) 144/93 (BP Location: Right Arm)   Pulse 72   Temp 97.6 F (36.4 C) (Oral)   Resp 18   Ht  (1.676 m)   Wt 170 lb (77.1 kg)   SpO2 99%   BMI 27.44 kg/m   Physical Exam  Constitutional: He is oriented to person, place, and time. He appears well-developed and well-nourished. No distress.  HENT:  Head: Normocephalic and atraumatic.  Mouth/Throat: No oropharyngeal exudate.  Eyes: Conjunctivae and EOM are normal. Pupils are equal, round, and reactive to light.  Neck: Normal range of motion. Neck supple.  Cardiovascular: Normal rate, regular rhythm, normal heart sounds and intact distal pulses.   Pulmonary/Chest: Effort normal and breath sounds normal. He has no wheezes. He has no rales.  Abdominal: Soft. Bowel sounds are normal. He exhibits no mass. There is  no tenderness. There is no rebound and no guarding.  Musculoskeletal: Normal range of motion.  Neurological: He is alert and oriented to person, place, and time. He displays normal reflexes.  Skin: Skin is warm and dry. Capillary refill takes less than 2 seconds.  Psychiatric: He has a normal mood and affect.  Nursing note and vitals reviewed.    ED Treatments / Results   Vitals:   07/28/16 0154  BP: (!) 144/93  Pulse: 72  Resp:  18  Temp: 97.6 F (36.4 C)    EKG  EKG Interpretation  Date/Time:  Saturday Tykwon Fera 07 2018 01:57:24 EDT Ventricular Rate:  70 PR Interval:    QRS Duration: 92 QT Interval:  411 QTC Calculation: 444 R Axis:   46 Text Interpretation:  Sinus rhythm Confirmed by [facility]  MD, Randee Upchurch ([id number]) on 07/28/2016 2:02:26 AM       Radiology Ct Renal Stone Study  Result Date: 07/28/2016 CLINICAL DATA:  Right lower abdominal pain all day. Diagnosed with urinary tract infection this afternoon. EXAM: CT ABDOMEN AND PELVIS WITHOUT CONTRAST TECHNIQUE: Multidetector CT imaging of the abdomen and pelvis was performed following the standard protocol without IV contrast. COMPARISON:  12/24/2014 CT abdomen and pelvis FINDINGS: Lower chest: The visualized cardiac chambers are within normal limits for size. No pericardial effusion. Small hiatal hernia is suggested. There is bibasilar atelectasis. Hepatobiliary: Contracted gallbladder. No space-occupying mass of the unenhanced liver. Calcification in the left hepatic lobe consistent with a granuloma. No biliary dilatation. Pancreas: Unremarkable. No pancreatic ductal dilatation or surrounding inflammatory changes. Spleen: Normal in size without focal abnormality. Adrenals/Urinary Tract: Adrenal glands are unremarkable. Kidneys are normal, without renal calculi, focal lesion, or hydronephrosis. Bladder is unremarkable. Stomach/Bowel: Contracted stomach. Normal small bowel rotation without obstruction or inflammation. Moderate colonic stool burden without inflammation or obstruction. Scattered colonic diverticulosis is noted without acute diverticulitis. Normal-appearing appendix. Vascular/Lymphatic: Aorto-bicommon-iliac atherosclerosis. No aneurysm. No lymphadenopathy by size criteria. Reproductive: Prostate is unremarkable. Other: No abdominal wall hernia or abnormality. No abdominopelvic ascites. Musculoskeletal: No acute or significant osseous findings. IMPRESSION: 1.  Moderate colonic stool burden without inflammation. Findings suggest constipation. No bowel obstruction is identified. 2. No nephrolithiasis nor obstructive uropathy. Electronically Signed   By: Tollie Eth M.D.   On: 07/28/2016 02:47    Procedures Procedures (including critical care time)  Medications Ordered in ED Medications  ketorolac (TORADOL) 30 MG/ML injection 30 mg (30 mg Intravenous Given 07/28/16 0240)     Final Clinical Impressions(s) / ED Diagnoses   Final diagnoses:  Constipation, unspecified constipation type   The patient is nontoxic-appearing on exam and vital signs are within normal limits. Return for fevers intractable pain, intractable vomiting, inability to pass stool, bleeding or any concerns. Start taking miralax daily.    After history, exam, and medical workup I feel the patient has been appropriately medically screened and is safe for discharge home. Pertinent diagnoses were discussed with the patient. Patient was given return precautions.    Cy Blamer, MD 07/28/16 [phone]

## 2016-08-02 ENCOUNTER — Encounter (HOSPITAL_BASED_OUTPATIENT_CLINIC_OR_DEPARTMENT_OTHER): Payer: Self-pay | Admitting: *Deleted

## 2016-08-02 DIAGNOSIS — F17210 Nicotine dependence, cigarettes, uncomplicated: Secondary | ICD-10-CM | POA: Diagnosis not present

## 2016-08-02 DIAGNOSIS — R1031 Right lower quadrant pain: Secondary | ICD-10-CM | POA: Diagnosis present

## 2016-08-02 NOTE — ED Triage Notes (Signed)
Pt c/o abd pain x 1 week, seen here 5 days ago for same DX constipation

## 2016-08-03 ENCOUNTER — Emergency Department (HOSPITAL_BASED_OUTPATIENT_CLINIC_OR_DEPARTMENT_OTHER): Payer: 59

## 2016-08-03 ENCOUNTER — Emergency Department (HOSPITAL_BASED_OUTPATIENT_CLINIC_OR_DEPARTMENT_OTHER)
Admission: EM | Admit: 2016-08-03 | Discharge: 2016-08-03 | Disposition: A | Discharge status: Home / Self Care | Payer: 59 | Attending: Emergency Medicine | Admitting: Emergency Medicine

## 2016-08-03 DIAGNOSIS — R1031 Right lower quadrant pain: Secondary | ICD-10-CM

## 2016-08-03 LAB — COMPREHENSIVE METABOLIC PANEL
ALK PHOS: 46 U/L (ref 38–126)
ALT: 22 U/L (ref 17–63)
AST: 21 U/L (ref 15–41)
Albumin: 4.2 g/dL (ref 3.5–5.0)
Anion gap: 5 (ref 5–15)
BUN: 15 mg/dL (ref 6–20)
CO2: 28 mmol/L (ref 22–32)
Calcium: 9.4 mg/dL (ref 8.9–10.3)
Chloride: 103 mmol/L (ref 101–111)
Creatinine, Ser: 0.74 mg/dL (ref 0.61–1.24)
GFR calc non Af Amer: >60 mL/min (ref >60)
Glucose, Bld: 103 mg/dL — ABNORMAL HIGH (ref 65–99)
Potassium: 3.9 mmol/L (ref 3.5–5.1)
SODIUM: 136 mmol/L (ref 135–145)
Total Bilirubin: 0.4 mg/dL (ref 0.3–1.2)
Total Protein: 6.8 g/dL (ref 6.5–8.1)

## 2016-08-03 LAB — CBC WITH DIFFERENTIAL/PLATELET
Basophils Absolute: 0 10*3/uL (ref 0.0–0.1)
Basophils Relative: 0 %
Eosinophils Absolute: 0.1 10*3/uL (ref 0.0–0.7)
Eosinophils Relative: 2 %
HCT: 41.7 % (ref 39.0–52.0)
HEMOGLOBIN: 14.8 g/dL (ref 13.0–17.0)
LYMPHS PCT: 45 %
Lymphs Abs: 3.5 10*3/uL (ref 0.7–4.0)
MCH: 32.7 pg (ref 26.0–34.0)
MCHC: 35.5 g/dL (ref 30.0–36.0)
MCV: 92.1 fL (ref 78.0–100.0)
MONOS PCT: 10 %
Monocytes Absolute: 0.8 10*3/uL (ref 0.1–1.0)
NEUTROS ABS: 3.5 10*3/uL (ref 1.7–7.7)
NEUTROS PCT: 43 %
Platelets: 171 10*3/uL (ref 150–400)
RBC: 4.53 MIL/uL (ref 4.22–5.81)
RDW: 11.6 % (ref 11.5–15.5)
WBC: 8 10*3/uL (ref 4.0–10.5)

## 2016-08-03 LAB — LIPASE, BLOOD: Lipase: 28 U/L (ref 11–51)

## 2016-08-03 MED ORDER — ONDANSETRON HCL 4 MG/2ML IJ SOLN
4.0000 mg | Freq: Once | INTRAMUSCULAR | Status: AC
  Administered 2016-08-03: 4 mg via INTRAVENOUS
  Filled 2016-08-03: qty 2

## 2016-08-03 MED ORDER — SODIUM CHLORIDE 0.9 % IV BOLUS (SEPSIS)
1000.0000 mL | Freq: Once | INTRAVENOUS | Status: AC
  Administered 2016-08-03: 1000 mL via INTRAVENOUS

## 2016-08-03 MED ORDER — ONDANSETRON 8 MG PO TBDP: 8.0000 mg | ORAL_TABLET | Freq: Three times a day (TID) | ORAL | 0 refills | Status: AC | PRN

## 2016-08-03 NOTE — ED Provider Notes (Addendum)
[facility] Provider Note: Lowella Dell, MD, FACEP  CSN: [id number] MRN: [id number] ARRIVAL: 08/02/16 at 2301 ROOM: [facility]11/[facility]   CHIEF COMPLAINT  Abdominal Pain   HISTORY OF PRESENT ILLNESS  Larry Waters is a 41 y.o. male with moderate abdominal pain for over a week. The pain is predominantly in the right lower quadrant and right flank. He was seen in the ED 6 days ago where a CT scan showed no evidence of a kidney stone but did show constipation. He was not discharged with any prescriptions for laxatives. He tried taking a bottle of magnesium citrate 2 days ago and he had multiple bowel movements over the next 2 days. Despite this he continues to have the abdominal pain. Pain is somewhat worse with movement. He states he has also been vomiting off and on for the past week. He also complains of a headache and ringing in the ears.  His wife is concerned that he has been taking his blood pressure too frequently and is getting anxious because his readings have been high. His latest blood pressure here was 138/89.    Past Medical History:  Diagnosis Date  . Anxiety   . GERD (gastroesophageal reflux disease)     History reviewed. No pertinent surgical history.  History reviewed. No pertinent family history.  Social History  Substance Use Topics  . Smoking status: Current Some Day Smoker    Years: 15.00    Types: Cigarettes  . Smokeless tobacco: Never Used  . Alcohol use Yes     Comment: sometimes    Prior to Admission medications   Medication Sig Start Date End Date Taking? Authorizing Provider  ciprofloxacin (CIPRO) 250 MG tablet Take 250 mg by mouth 2 (two) times daily.    Historical Provider, MD  citalopram (CELEXA) 20 MG tablet PT NEEDS APPT FOR FURTHER REFILLS. 04/28/13   Godfrey Pick, PA-C  ranitidine (ZANTAC) 150 MG tablet Take 150 mg by mouth as needed.    Historical Provider, MD    Allergies Patient has no known allergies.   REVIEW OF SYSTEMS    Negative except as noted here or in the History of Present Illness.   PHYSICAL EXAMINATION  Initial Vital Signs Blood pressure (!) 151/96, pulse 64, temperature 98.1 F (36.7 C), resp. rate 16, height  (1.676 m), weight 170 lb (77.1 kg), SpO2 100 %.  Examination General: Well-developed, well-nourished male in no acute distress; appearance consistent with age of record HENT: normocephalic; atraumatic Eyes: pupils equal, round and reactive to light; extraocular muscles intact Neck: supple Heart: regular rate and rhythm Lungs: clear to auscultation bilaterally Abdomen: soft; nondistended; right lower quadrant tenderness; no masses or hepatosplenomegaly; bowel sounds present Rectal: Normal sphincter tone; no stool in rectal vault Extremities: No deformity; full range of motion; pulses normal Neurologic: Awake, alert and oriented; motor function intact in all extremities and symmetric; no facial droop Skin: Warm and dry Psychiatric: Flat affect   RESULTS  Summary of this visit's results, reviewed by myself:   EKG Interpretation  Date/Time:    Ventricular Rate:    PR Interval:    QRS Duration:   QT Interval:    QTC Calculation:   R Axis:     Text Interpretation:        Laboratory Studies: Results for orders placed or performed during the hospital encounter of 08/03/16 (from the past 24 hour(s))  CBC with Differential/Platelet     Status: None   Collection Time: 08/03/16  1:14 AM  Result Value Ref Range   WBC 8.0 4.0 - 10.5 K/uL   RBC 4.53 4.22 - 5.81 MIL/uL   Hemoglobin 14.8 13.0 - 17.0 g/dL   HCT 16.1 09.6 - 04.5 %   MCV 92.1 78.0 - 100.0 fL   MCH 32.7 26.0 - 34.0 pg   MCHC 35.5 30.0 - 36.0 g/dL   RDW 40.9 81.1 - 91.4 %   Platelets 171 150 - 400 K/uL   Neutrophils Relative % 43 %   Neutro Abs 3.5 1.7 - 7.7 K/uL   Lymphocytes Relative 45 %   Lymphs Abs 3.5 0.7 - 4.0 K/uL   Monocytes Relative 10 %   Monocytes Absolute 0.8 0.1 - 1.0 K/uL   Eosinophils  Relative 2 %   Eosinophils Absolute 0.1 0.0 - 0.7 K/uL   Basophils Relative 0 %   Basophils Absolute 0.0 0.0 - 0.1 K/uL  Comprehensive metabolic panel     Status: Abnormal   Collection Time: 08/03/16  1:14 AM  Result Value Ref Range   Sodium 136 135 - 145 mmol/L   Potassium 3.9 3.5 - 5.1 mmol/L   Chloride 103 101 - 111 mmol/L   CO2 28 22 - 32 mmol/L   Glucose, Bld 103 (H) 65 - 99 mg/dL   BUN 15 6 - 20 mg/dL   Creatinine, Ser 7.82 0.61 - 1.24 mg/dL   Calcium 9.4 8.9 - 95.6 mg/dL   Total Protein 6.8 6.5 - 8.1 g/dL   Albumin 4.2 3.5 - 5.0 g/dL   AST 21 15 - 41 U/L   ALT 22 17 - 63 U/L   Alkaline Phosphatase 46 38 - 126 U/L   Total Bilirubin 0.4 0.3 - 1.2 mg/dL   GFR calc non Af Amer >60 >60 mL/min   GFR calc Af Amer >60 >60 mL/min   Anion gap 5 5 - 15  Lipase, blood     Status: None   Collection Time: 08/03/16  1:14 AM  Result Value Ref Range   Lipase 28 11 - 51 U/L   Imaging Studies: Dg Abd Acute W/chest  Result Date: 08/03/2016 CLINICAL DATA:  Acute onset of right lower quadrant abdominal pain and constipation. Initial encounter. EXAM: DG ABDOMEN ACUTE W/ 1V CHEST COMPARISON:  Chest radiograph performed 04/08/2009, and CT of the abdomen and pelvis from 07/28/2016 FINDINGS: The lungs are well-aerated and clear. There is no evidence of focal opacification, pleural effusion or pneumothorax. The cardiomediastinal silhouette is within normal limits. The visualized bowel gas pattern is unremarkable. Scattered stool and air are seen within the colon; there is no evidence of small bowel dilatation to suggest obstruction. No free intra-abdominal air is identified on the provided upright view. No acute osseous abnormalities are seen; the sacroiliac joints are unremarkable in appearance. IMPRESSION: 1. Unremarkable bowel gas pattern; no free intra-abdominal air seen. Small to moderate amount of stool noted in the colon. 2. No acute cardiopulmonary process seen. Electronically Signed   By: Roanna Raider M.D.   On: 08/03/2016 01:41    ED COURSE  Nursing notes and initial vitals signs, including pulse oximetry, reviewed.  Vitals:   08/02/16 2304 08/02/16 2306 08/03/16 0135  BP:  (!) 151/96 138/89  Pulse:  64 (!) 56  Resp:  16 18  Temp:  98.1 F (36.7 C)   SpO2:  100% 98%  Weight: 170 lb (77.1 kg)    Height:  (1.676 m)     2:07 AM Abdominal film still  shows significant stool burden in the cecum and ascending colon. The transverse and descending colon are largely devoid of stool. His right sided pain is likely due to continued stool burden on the right and he was advised to continue with laxative regimen. He was advised to take MiraLAX daily for several days. If symptoms persist he was advised to follow-up with his primary care physician, Dr. Luiz Iron, with possible referral to gastroenterology.  PROCEDURES    ED DIAGNOSES     ICD-9-CM ICD-10-CM   1. Right lower quadrant abdominal pain 789.03 R10.31        Paula Libra, MD 08/03/16 1610    Paula Libra, MD 08/03/16 [id number]

## 2016-08-13 ENCOUNTER — Emergency Department (HOSPITAL_BASED_OUTPATIENT_CLINIC_OR_DEPARTMENT_OTHER): Payer: 59

## 2016-08-13 ENCOUNTER — Emergency Department (HOSPITAL_BASED_OUTPATIENT_CLINIC_OR_DEPARTMENT_OTHER)
Admission: EM | Admit: 2016-08-13 | Discharge: 2016-08-14 | Disposition: A | Discharge status: Home / Self Care | Payer: 59 | Attending: Emergency Medicine | Admitting: Emergency Medicine

## 2016-08-13 ENCOUNTER — Encounter (HOSPITAL_BASED_OUTPATIENT_CLINIC_OR_DEPARTMENT_OTHER): Payer: Self-pay | Admitting: *Deleted

## 2016-08-13 DIAGNOSIS — F17210 Nicotine dependence, cigarettes, uncomplicated: Secondary | ICD-10-CM | POA: Diagnosis not present

## 2016-08-13 DIAGNOSIS — R51 Headache: Secondary | ICD-10-CM | POA: Insufficient documentation

## 2016-08-13 DIAGNOSIS — R519 Headache, unspecified: Secondary | ICD-10-CM

## 2016-08-13 LAB — CBC WITH DIFFERENTIAL/PLATELET
BASOS PCT: 1 %
Basophils Absolute: 0.1 10*3/uL (ref 0.0–0.1)
Eosinophils Absolute: 0.1 10*3/uL (ref 0.0–0.7)
Eosinophils Relative: 1 %
HCT: 44.8 % (ref 39.0–52.0)
Hemoglobin: 15.7 g/dL (ref 13.0–17.0)
Lymphocytes Relative: 32 %
Lymphs Abs: 3.3 10*3/uL (ref 0.7–4.0)
MCH: 32.5 pg (ref 26.0–34.0)
MCHC: 35 g/dL (ref 30.0–36.0)
MCV: 92.8 fL (ref 78.0–100.0)
MONO ABS: 0.9 10*3/uL (ref 0.1–1.0)
Monocytes Relative: 8 %
Neutro Abs: 6.1 10*3/uL (ref 1.7–7.7)
Neutrophils Relative %: 58 %
Platelets: 183 10*3/uL (ref 150–400)
RBC: 4.83 MIL/uL (ref 4.22–5.81)
RDW: 11.9 % (ref 11.5–15.5)
WBC: 10.4 10*3/uL (ref 4.0–10.5)

## 2016-08-13 LAB — URINALYSIS, ROUTINE W REFLEX MICROSCOPIC
Bilirubin Urine: NEGATIVE
Glucose, UA: NEGATIVE mg/dL
Hgb urine dipstick: NEGATIVE
Ketones, ur: NEGATIVE mg/dL
Leukocytes, UA: NEGATIVE
NITRITE: NEGATIVE
Protein, ur: NEGATIVE mg/dL
Specific Gravity, Urine: 1.015 (ref 1.005–1.030)
pH: 6.5 (ref 5.0–8.0)

## 2016-08-13 LAB — BASIC METABOLIC PANEL
Anion gap: 10 (ref 5–15)
BUN: 11 mg/dL (ref 6–20)
CALCIUM: 9.8 mg/dL (ref 8.9–10.3)
CO2: 25 mmol/L (ref 22–32)
Chloride: 106 mmol/L (ref 101–111)
Creatinine, Ser: 0.89 mg/dL (ref 0.61–1.24)
GFR calc Af Amer: >60 mL/min (ref >60)
Glucose, Bld: 108 mg/dL — ABNORMAL HIGH (ref 65–99)
Potassium: 3.7 mmol/L (ref 3.5–5.1)
Sodium: 141 mmol/L (ref 135–145)

## 2016-08-13 LAB — I-STAT CG4 LACTIC ACID, ED: Lactic Acid, Venous: 1.17 mmol/L (ref 0.5–1.9)

## 2016-08-13 MED ORDER — KETOROLAC TROMETHAMINE 60 MG/2ML IM SOLN
60.0000 mg | Freq: Once | INTRAMUSCULAR | Status: AC
  Administered 2016-08-13: 60 mg via INTRAMUSCULAR
  Filled 2016-08-13: qty 2

## 2016-08-13 MED ORDER — DIPHENHYDRAMINE HCL 25 MG PO CAPS
25.0000 mg | ORAL_CAPSULE | Freq: Once | ORAL | Status: AC
  Administered 2016-08-13: 25 mg via ORAL
  Filled 2016-08-13: qty 1

## 2016-08-13 MED ORDER — METOCLOPRAMIDE HCL 5 MG/ML IJ SOLN
10.0000 mg | Freq: Once | INTRAMUSCULAR | Status: AC
  Administered 2016-08-13: 10 mg via INTRAMUSCULAR
  Filled 2016-08-13: qty 2

## 2016-08-13 NOTE — ED Triage Notes (Signed)
Abdominal pain for a week. Mouth is numb. Anxiety. He has a headache and ringing in his ears. Vomiting.

## 2016-08-13 NOTE — ED Provider Notes (Signed)
[facility] Provider Note   CSN: [id number] Arrival date & time: 08/13/16  1913  By signing my name below, I, [name], attest that this documentation has been prepared under the direction and in the presence of Brinsley Wence, MD . Electronically Signed: [name], ED Scribe. 08/13/2016. 11:11 PM.   History   Chief Complaint Chief Complaint  Patient presents with  . Headache    The history is provided by the patient. No language interpreter was used.  Headache   This is a new problem. The current episode started more than 1 week ago. The problem occurs constantly. The problem has not changed since onset.The headache is associated with nothing. The quality of the pain is described as dull. The pain is moderate. The pain does not radiate. Pertinent negatives include no anorexia, no fever, no malaise/fatigue, no chest pressure, no near-syncope, no orthopnea, no palpitations, no syncope, no shortness of breath, no nausea and no vomiting. He has tried NSAIDs for the symptoms. The treatment provided no relief.  HPI Comments:  Larry Waters is a 41 y.o. male who presents to the Emergency Department complaining of constant headahce x 2 weeks. He states that the pain is primarily on the top of his head, and he also notes pain in his left axilla. Pt has a hx of bilateral ear pain and endorses bilateral ear pain at this time. Pt took ibuprofen at 1700 today. Denies fever.    Past Medical History:  Diagnosis Date  . Anxiety   . GERD (gastroesophageal reflux disease)     Patient Active Problem List   Diagnosis Date Noted  . Anxiety 05/27/2011  . Depression 05/27/2011    History reviewed. No pertinent surgical history.     Home Medications    Prior to Admission medications   Medication Sig Start Date End Date Taking? Authorizing Provider  citalopram (CELEXA) 20 MG tablet PT NEEDS APPT FOR FURTHER REFILLS. 04/28/13  Yes Eleanore E Egan, PA-C  ondansetron  (ZOFRAN ODT) 8 MG disintegrating tablet Take 1 tablet (8 mg total) by mouth every 8 (eight) hours as needed for nausea or vomiting. 08/03/16  Yes John Molpus, MD  ranitidine (ZANTAC) 150 MG tablet Take 150 mg by mouth as needed.   Yes Historical Provider, MD    Family History No family history on file.  Social History Social History  Substance Use Topics  . Smoking status: Current Some Day Smoker    Years: 15.00    Types: Cigarettes  . Smokeless tobacco: Never Used  . Alcohol use Yes     Comment: sometimes     Allergies   Patient has no known allergies.   Review of Systems Review of Systems  Constitutional: Negative for fever and malaise/fatigue.  HENT: Negative for ear pain.   Eyes: Negative for photophobia and visual disturbance.  Respiratory: Negative for shortness of breath.   Cardiovascular: Negative for palpitations, orthopnea, syncope and near-syncope.  Gastrointestinal: Negative for anorexia, nausea and vomiting.  Musculoskeletal: Negative for neck pain and neck stiffness.  Neurological: Positive for headaches. Negative for dizziness, tremors, seizures, syncope, facial asymmetry, speech difficulty, weakness, light-headedness and numbness.  Psychiatric/Behavioral: Negative for confusion and decreased concentration.  All other systems reviewed and are negative.    Physical Exam Updated Vital Signs BP (!) 157/97   Pulse 81   Temp 98.3 F (36.8 C) (Oral)   Resp 18   Ht  (1.676 m)   Wt 170 lb (77.1 kg)  SpO2 100%   BMI 27.44 kg/m   Physical Exam  Constitutional: He is oriented to person, place, and time. He appears well-developed and well-nourished. No distress.  HENT:  Head: Normocephalic and atraumatic.  Right Ear: Tympanic membrane normal.  Left Ear: Tympanic membrane normal.  Mouth/Throat: Oropharynx is clear and moist. No oropharyngeal exudate.  Trachea midline  Eyes: Conjunctivae and EOM are normal. Pupils are equal, round, and reactive to  light.  No proptosis.   Neck: Trachea normal and normal range of motion. Neck supple. No JVD present. Carotid bruit is not present.  Cardiovascular: Normal rate and regular rhythm.  Exam reveals no gallop and no friction rub.   No murmur heard. Pulmonary/Chest: Effort normal and breath sounds normal. No stridor. He has no wheezes. He has no rales.  Abdominal: Soft. Bowel sounds are normal. He exhibits no mass. There is no tenderness. There is no rebound and no guarding.  Musculoskeletal: Normal range of motion.  Lymphadenopathy:    He has no cervical adenopathy.  Neurological: He is alert and oriented to person, place, and time. He has normal reflexes. He displays normal reflexes. No cranial nerve deficit. He exhibits normal muscle tone. Coordination normal.  Cranial nerves 2-12 intact  Skin: Skin is warm and dry. Capillary refill takes less than 2 seconds. He is not diaphoretic.  Psychiatric: He has a normal mood and affect. His behavior is normal.  Intact cognition     ED Treatments / Results   Vitals:   08/13/16 2155 08/14/16 0032  BP: (!) 157/97 117/71  Pulse: 81 (!) 55  Resp: 18 18  Temp: 98.3 F (36.8 C)     DIAGNOSTIC STUDIES: Oxygen Saturation is 100% on RA, normal by my interpretation.    COORDINATION OF CARE: 11:02 PM Will order CT of head and will discharge. Discussed treatment plan with pt at bedside and pt agreed to plan.   Labs (all labs ordered are listed, but only abnormal results are displayed) Results for orders placed or performed during the hospital encounter of 08/13/16  CBC with Differential  Result Value Ref Range   WBC 10.4 4.0 - 10.5 K/uL   RBC 4.83 4.22 - 5.81 MIL/uL   Hemoglobin 15.7 13.0 - 17.0 g/dL   HCT 45.4 09.8 - 11.9 %   MCV 92.8 78.0 - 100.0 fL   MCH 32.5 26.0 - 34.0 pg   MCHC 35.0 30.0 - 36.0 g/dL   RDW 14.7 82.9 - 56.2 %   Platelets 183 150 - 400 K/uL   Neutrophils Relative % 58 %   Neutro Abs 6.1 1.7 - 7.7 K/uL   Lymphocytes  Relative 32 %   Lymphs Abs 3.3 0.7 - 4.0 K/uL   Monocytes Relative 8 %   Monocytes Absolute 0.9 0.1 - 1.0 K/uL   Eosinophils Relative 1 %   Eosinophils Absolute 0.1 0.0 - 0.7 K/uL   Basophils Relative 1 %   Basophils Absolute 0.1 0.0 - 0.1 K/uL  Basic metabolic panel  Result Value Ref Range   Sodium 141 135 - 145 mmol/L   Potassium 3.7 3.5 - 5.1 mmol/L   Chloride 106 101 - 111 mmol/L   CO2 25 22 - 32 mmol/L   Glucose, Bld 108 (H) 65 - 99 mg/dL   BUN 11 6 - 20 mg/dL   Creatinine, Ser 1.30 0.61 - 1.24 mg/dL   Calcium 9.8 8.9 - 86.5 mg/dL   GFR calc non Af Amer >60 >60 mL/min   GFR calc  Af Amer >60 >60 mL/min   Anion gap 10 5 - 15  Urinalysis, Routine w reflex microscopic  Result Value Ref Range   Color, Urine YELLOW YELLOW   APPearance CLEAR CLEAR   Specific Gravity, Urine 1.015 1.005 - 1.030   pH 6.5 5.0 - 8.0   Glucose, UA NEGATIVE NEGATIVE mg/dL   Hgb urine dipstick NEGATIVE NEGATIVE   Bilirubin Urine NEGATIVE NEGATIVE   Ketones, ur NEGATIVE NEGATIVE mg/dL   Protein, ur NEGATIVE NEGATIVE mg/dL   Nitrite NEGATIVE NEGATIVE   Leukocytes, UA NEGATIVE NEGATIVE  I-Stat CG4 Lactic Acid, ED  Result Value Ref Range   Lactic Acid, Venous 1.17 0.5 - 1.9 mmol/L   Ct Head Wo Contrast  Result Date: 08/14/2016 CLINICAL DATA:  Headache x2 weeks at the top of the head. Bilateral ear pain. EXAM: CT HEAD WITHOUT CONTRAST TECHNIQUE: Contiguous axial images were obtained from the base of the skull through the vertex without intravenous contrast. COMPARISON:  11/01/2009 CT FINDINGS: Brain: No evidence of acute infarction, hemorrhage, hydrocephalus, extra-axial collection or mass lesion/mass effect. Vascular: No hyperdense vessel or unexpected calcification. Skull: Normal. Negative for fracture or focal lesion. Sinuses/Orbits: No acute finding. Other: Minimal anterior right mastoid effusion, this appears chronic. Otherwise CT negative. IMPRESSION: Minimal opacification of the anterior right mastoid  consistent with a chronic tiny mastoid effusion. No acute intracranial abnormality. Electronically Signed   By: Tollie Eth M.D.   On: 08/14/2016 00:45   Dg Abd Acute W/chest  Result Date: 08/03/2016 CLINICAL DATA:  Acute onset of right lower quadrant abdominal pain and constipation. Initial encounter. EXAM: DG ABDOMEN ACUTE W/ 1V CHEST COMPARISON:  Chest radiograph performed 04/08/2009, and CT of the abdomen and pelvis from 07/28/2016 FINDINGS: The lungs are well-aerated and clear. There is no evidence of focal opacification, pleural effusion or pneumothorax. The cardiomediastinal silhouette is within normal limits. The visualized bowel gas pattern is unremarkable. Scattered stool and air are seen within the colon; there is no evidence of small bowel dilatation to suggest obstruction. No free intra-abdominal air is identified on the provided upright view. No acute osseous abnormalities are seen; the sacroiliac joints are unremarkable in appearance. IMPRESSION: 1. Unremarkable bowel gas pattern; no free intra-abdominal air seen. Small to moderate amount of stool noted in the colon. 2. No acute cardiopulmonary process seen. Electronically Signed   By: Roanna Raider M.D.   On: 08/03/2016 01:41   Ct Renal Stone Study  Result Date: 07/28/2016 CLINICAL DATA:  Right lower abdominal pain all day. Diagnosed with urinary tract infection this afternoon. EXAM: CT ABDOMEN AND PELVIS WITHOUT CONTRAST TECHNIQUE: Multidetector CT imaging of the abdomen and pelvis was performed following the standard protocol without IV contrast. COMPARISON:  12/24/2014 CT abdomen and pelvis FINDINGS: Lower chest: The visualized cardiac chambers are within normal limits for size. No pericardial effusion. Small hiatal hernia is suggested. There is bibasilar atelectasis. Hepatobiliary: Contracted gallbladder. No space-occupying mass of the unenhanced liver. Calcification in the left hepatic lobe consistent with a granuloma. No biliary  dilatation. Pancreas: Unremarkable. No pancreatic ductal dilatation or surrounding inflammatory changes. Spleen: Normal in size without focal abnormality. Adrenals/Urinary Tract: Adrenal glands are unremarkable. Kidneys are normal, without renal calculi, focal lesion, or hydronephrosis. Bladder is unremarkable. Stomach/Bowel: Contracted stomach. Normal small bowel rotation without obstruction or inflammation. Moderate colonic stool burden without inflammation or obstruction. Scattered colonic diverticulosis is noted without acute diverticulitis. Normal-appearing appendix. Vascular/Lymphatic: Aorto-bicommon-iliac atherosclerosis. No aneurysm. No lymphadenopathy by size criteria. Reproductive: Prostate is unremarkable. Other:  No abdominal wall hernia or abnormality. No abdominopelvic ascites. Musculoskeletal: No acute or significant osseous findings. IMPRESSION: 1. Moderate colonic stool burden without inflammation. Findings suggest constipation. No bowel obstruction is identified. 2. No nephrolithiasis nor obstructive uropathy. Electronically Signed   By: Tollie Eth M.D.   On: 07/28/2016 02:47    Procedures Procedures (including critical care time)  Medications Ordered in ED  Medications  ketorolac (TORADOL) injection 60 mg (60 mg Intramuscular Given 08/13/16 2329)  metoCLOPramide (REGLAN) injection 10 mg (10 mg Intramuscular Given 08/13/16 2329)  diphenhydrAMINE (BENADRYL) capsule 25 mg (25 mg Oral Given 08/13/16 2329)    Final Clinical Impressions(s) / ED Diagnoses  Headache:   Follow up with your PMD. Exam and vitals are benign and reassuring. Return for fevers, or neck pain or stiffness, weakness numbness changes in vision or cognition,  intractable vomiting or pain or any concerns. Patient is extremely well appearing and exam and imaging are negative for acute finding.    After history, exam, and medical workup I feel the patient has been appropriately medically screened and is safe for  discharge home. Pertinent diagnoses were discussed with the patient. Patient was given return precautions.  I personally performed the services described in this documentation, which was scribed in my presence. The recorded information has been reviewed and is accurate.       Cy Blamer, MD 08/14/16 [phone]

## 2016-08-14 MED ORDER — METOCLOPRAMIDE HCL 10 MG PO TABS: 10.0000 mg | ORAL_TABLET | Freq: Four times a day (QID) | ORAL | 0 refills | Status: DC | PRN

## 2016-08-15 DIAGNOSIS — K0510 Chronic gingivitis, plaque induced: Secondary | ICD-10-CM | POA: Insufficient documentation

## 2016-08-15 DIAGNOSIS — K5900 Constipation, unspecified: Secondary | ICD-10-CM | POA: Insufficient documentation

## 2016-08-15 DIAGNOSIS — G8929 Other chronic pain: Secondary | ICD-10-CM | POA: Insufficient documentation

## 2018-07-24 IMAGING — DX DG ABDOMEN ACUTE W/ 1V CHEST
4 series · 4 of 4 positions shown · non-contrast
Comparison: Chest radiograph performed 04/08/2009, and CT of the
abdomen and pelvis from 07/28/2016

CLINICAL DATA: Acute onset of right lower quadrant abdominal pain
and constipation. Initial encounter.

EXAM:
DG ABDOMEN ACUTE W/ 1V CHEST

[chest pa]
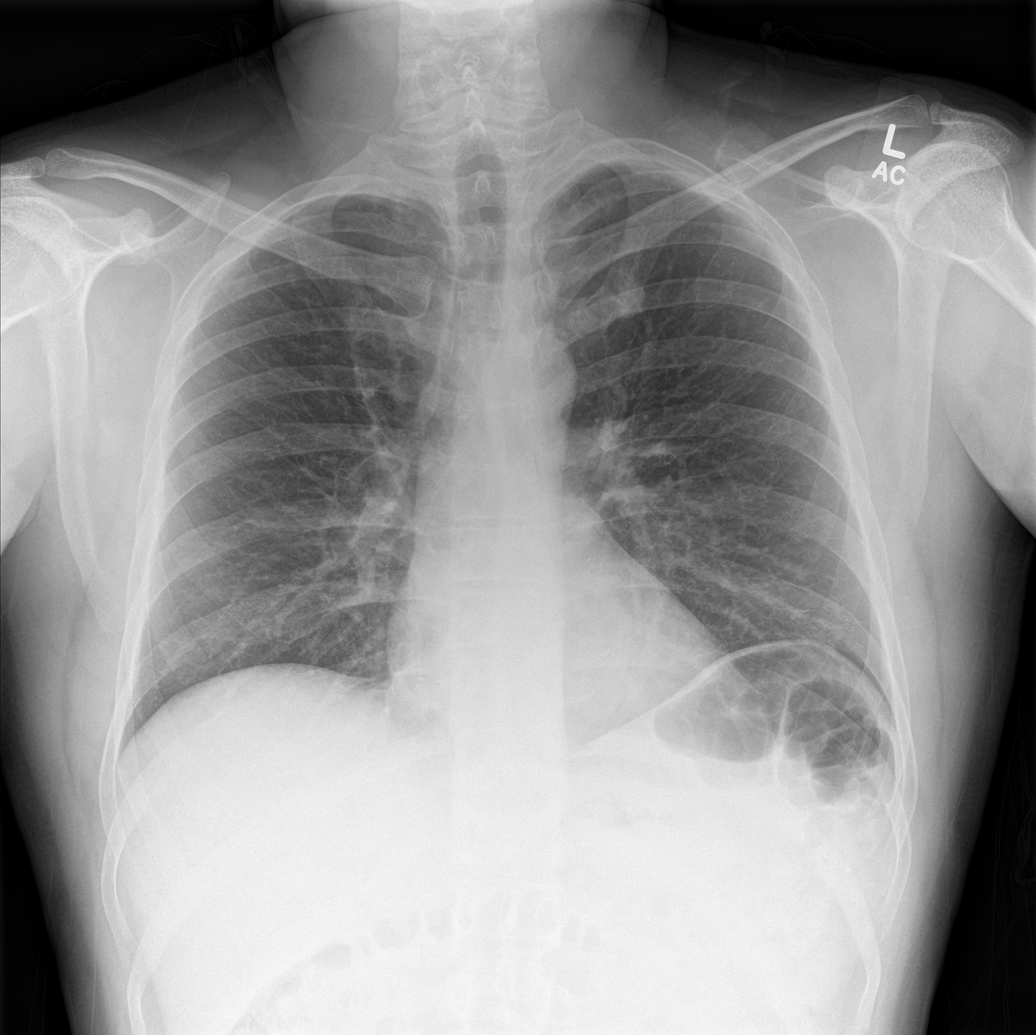

[abdomen erect]
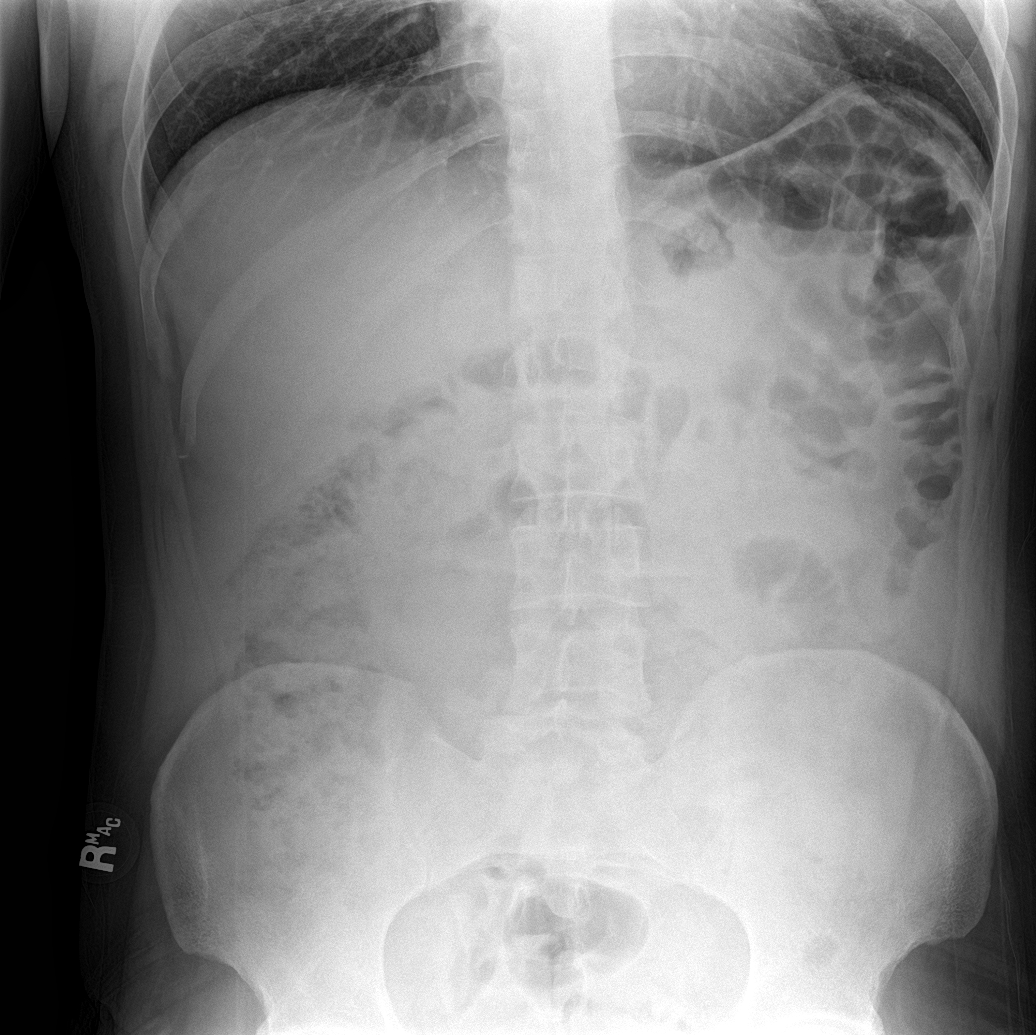

[abdomen supine (1 of 2)]
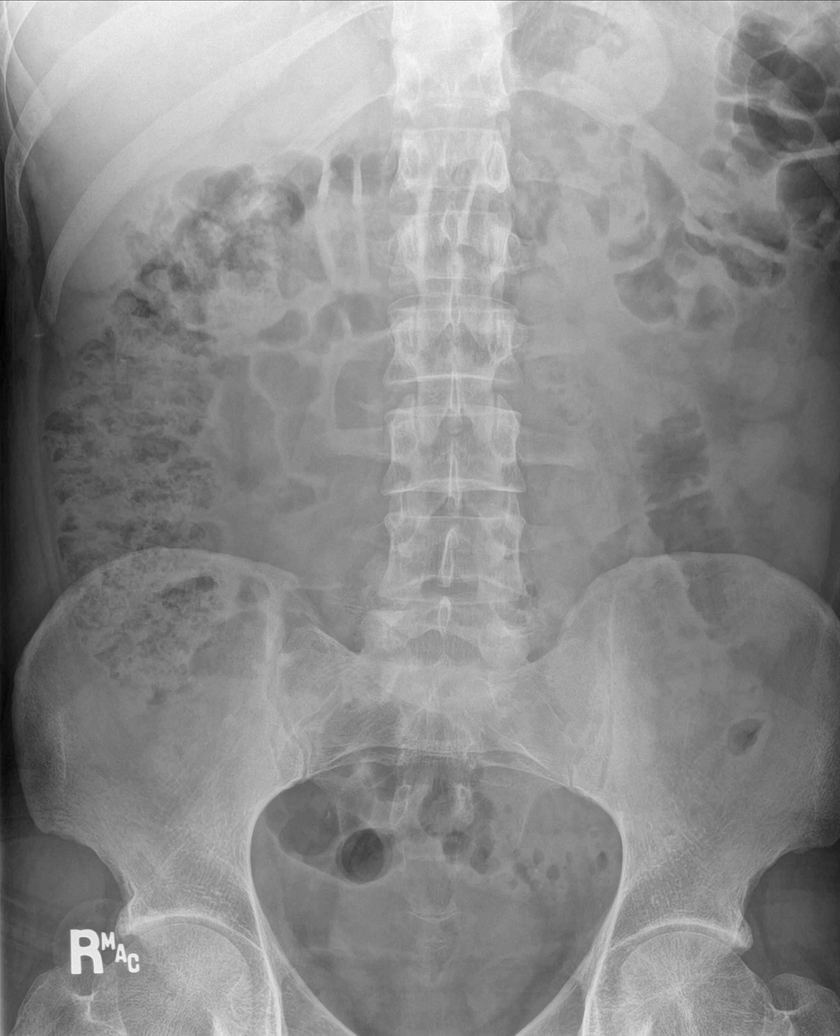

[abdomen supine (2 of 2)]
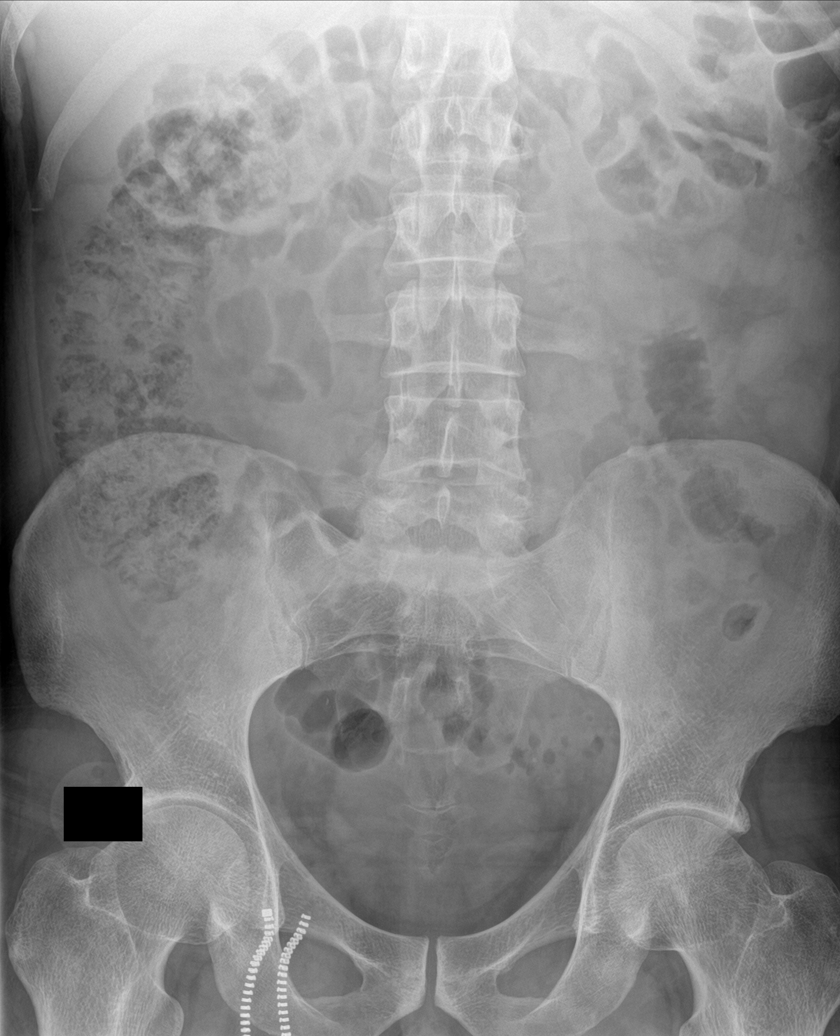

[4 of 4 positions shown; findings below may reference images not displayed]

FINDINGS: The lungs are well-aerated and clear. There is no evidence of focal
opacification, pleural effusion or pneumothorax. The
cardiomediastinal silhouette is within normal limits.

The visualized bowel gas pattern is unremarkable. Scattered stool
and air are seen within the colon; there is no evidence of small
bowel dilatation to suggest obstruction. No free intra-abdominal air
is identified on the provided upright view.

No acute osseous abnormalities are seen; the sacroiliac joints are
unremarkable in appearance.
IMPRESSION: 1. Unremarkable bowel gas pattern; no free intra-abdominal air seen.
Small to moderate amount of stool noted in the colon.
2. No acute cardiopulmonary process seen.

## 2020-01-13 DIAGNOSIS — M5416 Radiculopathy, lumbar region: Secondary | ICD-10-CM | POA: Insufficient documentation

## 2020-01-13 DIAGNOSIS — G8929 Other chronic pain: Secondary | ICD-10-CM | POA: Insufficient documentation

## 2020-11-28 DIAGNOSIS — G43009 Migraine without aura, not intractable, without status migrainosus: Secondary | ICD-10-CM | POA: Insufficient documentation

## 2021-10-04 ENCOUNTER — Encounter (HOSPITAL_BASED_OUTPATIENT_CLINIC_OR_DEPARTMENT_OTHER): Payer: Self-pay | Admitting: Emergency Medicine

## 2021-10-04 ENCOUNTER — Other Ambulatory Visit: Payer: Self-pay

## 2021-10-04 ENCOUNTER — Emergency Department (HOSPITAL_BASED_OUTPATIENT_CLINIC_OR_DEPARTMENT_OTHER)
Admission: EM | Admit: 2021-10-04 | Discharge: 2021-10-04 | Discharge status: Against Medical Advice | Payer: 59 | Attending: Emergency Medicine | Admitting: Emergency Medicine

## 2021-10-04 DIAGNOSIS — Z5321 Procedure and treatment not carried out due to patient leaving prior to being seen by health care provider: Secondary | ICD-10-CM | POA: Insufficient documentation

## 2021-10-04 DIAGNOSIS — I10 Essential (primary) hypertension: Secondary | ICD-10-CM | POA: Diagnosis present

## 2021-10-04 NOTE — ED Triage Notes (Signed)
Pt states his blood pressure readings have been elevated at home for the past couple of days

## 2022-03-26 ENCOUNTER — Ambulatory Visit: Payer: 59 | Admitting: Occupational Medicine

## 2022-06-25 ENCOUNTER — Encounter: Payer: Self-pay | Admitting: Family Medicine

## 2022-06-25 ENCOUNTER — Ambulatory Visit: Payer: PRIVATE HEALTH INSURANCE | Admitting: Family Medicine

## 2022-06-25 VITALS — BP 140/88 | HR 87 | Temp 98.6°F | Ht 66.0 in | Wt 174.0 lb

## 2022-06-25 DIAGNOSIS — I10 Essential (primary) hypertension: Secondary | ICD-10-CM

## 2022-06-25 DIAGNOSIS — E7800 Pure hypercholesterolemia, unspecified: Secondary | ICD-10-CM

## 2022-06-25 DIAGNOSIS — F411 Generalized anxiety disorder: Secondary | ICD-10-CM

## 2022-06-25 MED ORDER — CITALOPRAM HYDROBROMIDE 20 MG PO TABS: ORAL_TABLET | ORAL | 1 refills | Status: AC

## 2022-06-25 MED ORDER — CLONAZEPAM 0.5 MG PO TABS: 0.5000 mg | ORAL_TABLET | Freq: Two times a day (BID) | ORAL | 0 refills | Status: AC | PRN

## 2022-06-25 MED ORDER — ROSUVASTATIN CALCIUM 10 MG PO TABS: 10.0000 mg | ORAL_TABLET | Freq: Every day | ORAL | 1 refills | Status: AC

## 2022-06-25 MED ORDER — METOPROLOL SUCCINATE ER 25 MG PO TB24: 25.0000 mg | ORAL_TABLET | Freq: Every day | ORAL | 0 refills | Status: AC

## 2022-06-25 MED ORDER — LISINOPRIL 10 MG PO TABS: 10.0000 mg | ORAL_TABLET | Freq: Every day | ORAL | 3 refills | Status: AC

## 2022-06-25 NOTE — Progress Notes (Signed)
New Patient Office Visit  Subjective    Patient ID: Larry Waters, male    DOB: 22-Jul-1975  Age: 47 y.o. MRN: [id number]  CC:  Chief Complaint  Patient presents with   New Patient (Initial Visit)    Refill on Clonazepam    HPI Larry Waters presents to establish care. Encounter Diagnoses  Name Primary?   Essential hypertension Yes   Elevated cholesterol    Generalized anxiety disorder    Presents for follow-up of the above.  He is accompanied by his wife.  Longstanding history of above.  He has been taking lisinopril and metoprolol on a as needed basis.  He takes Celexa 20 mg daily for anxiety.  He takes clonazepam as needed.  Assures me that he rarely uses the clonazepam.  He was written for 60 tablets back in 2022 and still has the prescription.  He takes rosuvastatin for elevated cholesterol.  His insurance changed and he is needing a new provider  Outpatient Encounter Medications as of 06/25/2022  Medication Sig   lisinopril (ZESTRIL) 10 MG tablet Take 1 tablet (10 mg total) by mouth daily.   ondansetron (ZOFRAN ODT) 8 MG disintegrating tablet Take 1 tablet (8 mg total) by mouth every 8 (eight) hours as needed for nausea or vomiting.   ranitidine (ZANTAC) 150 MG tablet Take 150 mg by mouth as needed.   [DISCONTINUED] citalopram (CELEXA) 20 MG tablet PT NEEDS APPT FOR FURTHER REFILLS.   [DISCONTINUED] clonazePAM (KLONOPIN) 0.5 MG tablet Take 0.5 mg by mouth 2 (two) times daily as needed for anxiety.   [DISCONTINUED] lisinopril (ZESTRIL) 5 MG tablet Take 5 mg by mouth daily.   [DISCONTINUED] metoCLOPramide (REGLAN) 10 MG tablet Take 1 tablet (10 mg total) by mouth every 6 (six) hours as needed for nausea (nausea/headache).   [DISCONTINUED] metoprolol succinate (TOPROL-XL) 25 MG 24 hr tablet Take 25 mg by mouth daily.   citalopram (CELEXA) 20 MG tablet PT NEEDS APPT FOR FURTHER REFILLS.   clonazePAM (KLONOPIN) 0.5 MG tablet Take 1 tablet (0.5 mg total) by mouth 2 (two) times daily as  needed for anxiety.   metoprolol succinate (TOPROL-XL) 25 MG 24 hr tablet Take 1 tablet (25 mg total) by mouth daily.   rosuvastatin (CRESTOR) 10 MG tablet Take 1 tablet (10 mg total) by mouth daily.   [DISCONTINUED] clonazePAM (KLONOPIN) 0.5 MG tablet Take one half to one tablet twice daily only if needed for extreme anxiety   [DISCONTINUED] fluticasone (FLONASE) 50 MCG/ACT nasal spray Place 2 sprays into the nose daily.   [DISCONTINUED] rosuvastatin (CRESTOR) 10 MG tablet Take 10 mg by mouth daily. (Patient not taking: Reported on 06/25/2022)   No facility-administered encounter medications on file as of 06/25/2022.    Past Medical History:  Diagnosis Date   Anxiety    GERD (gastroesophageal reflux disease)     History reviewed. No pertinent surgical history.  Family History  Problem Relation Age of Onset   Hypertension Mother    Leukemia Father     Social History   Socioeconomic History   Marital status: Married    Spouse name: Not on file   Number of children: Not on file   Years of education: Not on file   Highest education level: Not on file  Occupational History   Not on file  Tobacco Use   Smoking status: Some Days    Packs/day: 0.50    Years: 15.00    Total pack years: 7.50    Types: Cigarettes  Smokeless tobacco: Never  Vaping Use   Vaping Use: Never used  Substance and Sexual Activity   Alcohol use: Not Currently    Comment: sometimes   Drug use: Never   Sexual activity: Not on file  Other Topics Concern   Not on file  Social History Narrative   Not on file   Social Determinants of Health   Financial Resource Strain: Not on file  Food Insecurity: Not on file  Transportation Needs: Not on file  Physical Activity: Not on file  Stress: Not on file  Social Connections: Not on file  Intimate Partner Violence: Not on file    Review of Systems  Constitutional: Negative.   HENT: Negative.    Eyes:  Negative for blurred vision, discharge and redness.   Respiratory: Negative.    Cardiovascular: Negative.   Gastrointestinal:  Negative for abdominal pain.  Genitourinary: Negative.   Musculoskeletal: Negative.  Negative for myalgias.  Skin:  Negative for rash.  Neurological:  Negative for tingling, loss of consciousness and weakness.  Endo/Heme/Allergies:  Negative for polydipsia.      06/25/2022    2:26 PM  Depression screen PHQ 2/9  Decreased Interest 0  Down, Depressed, Hopeless 0  PHQ - 2 Score 0         Objective    BP (!) 140/88 (BP Location: Right Arm, Patient Position: Sitting, Cuff Size: Large)   Pulse 87   Temp 98.6 F (37 C) (Oral)   Ht '5\' 6"'$  (1.676 m)   Wt 174 lb (78.9 kg)   SpO2 96%   BMI 28.08 kg/m   Physical Exam Constitutional:      General: He is not in acute distress.    Appearance: Normal appearance. He is not ill-appearing, toxic-appearing or diaphoretic.  HENT:     Head: Normocephalic and atraumatic.     Right Ear: External ear normal.     Left Ear: External ear normal.  Eyes:     General: No scleral icterus.       Right eye: No discharge.        Left eye: No discharge.     Extraocular Movements: Extraocular movements intact.     Conjunctiva/sclera: Conjunctivae normal.  Pulmonary:     Effort: Pulmonary effort is normal. No respiratory distress.  Skin:    General: Skin is warm and dry.  Neurological:     Mental Status: He is alert and oriented to person, place, and time.  Psychiatric:        Mood and Affect: Mood normal.        Behavior: Behavior normal.         Assessment & Plan:   Essential hypertension -     Lisinopril; Take 1 tablet (10 mg total) by mouth daily.  Dispense: 90 tablet; Refill: 3 -     Metoprolol Succinate ER; Take 1 tablet (25 mg total) by mouth daily.  Dispense: 90 tablet; Refill: 0  Elevated cholesterol -     Rosuvastatin Calcium; Take 1 tablet (10 mg total) by mouth daily.  Dispense: 90 tablet; Refill: 1  Generalized anxiety disorder -     clonazePAM;  Take 1 tablet (0.5 mg total) by mouth 2 (two) times daily as needed for anxiety.  Dispense: 20 tablet; Refill: 0 -     Citalopram Hydrobromide; PT NEEDS APPT FOR FURTHER REFILLS.  Dispense: 90 tablet; Refill: 1     Return in about 2 months (around 08/25/2022), or Return fasting for follow-up and  blood work in 2 months..  Written for 0.5 clonazepam to be used up to twice daily on a strictly as-needed basis.  20 pills were given.  An a firm discussion that the dosage and/or amount would not be escalated.  He assured me that that would not happen.  Continue with metoprolol lisinopril and rosuvastatin.  Libby Maw, MD

## 2022-08-28 ENCOUNTER — Ambulatory Visit: Payer: PRIVATE HEALTH INSURANCE | Admitting: Family Medicine

## 2022-12-30 NOTE — Progress Notes (Signed)
No Show

## 2022-12-31 ENCOUNTER — Ambulatory Visit: Payer: PRIVATE HEALTH INSURANCE | Admitting: Cardiology

## 2022-12-31 ENCOUNTER — Encounter: Payer: Self-pay | Admitting: Cardiology

## 2022-12-31 ENCOUNTER — Other Ambulatory Visit: Payer: Self-pay

## 2022-12-31 VITALS — BP 125/79 | HR 68 | Resp 16 | Ht 66.0 in | Wt 175.2 lb

## 2022-12-31 DIAGNOSIS — I10 Essential (primary) hypertension: Secondary | ICD-10-CM

## 2022-12-31 DIAGNOSIS — E7800 Pure hypercholesterolemia, unspecified: Secondary | ICD-10-CM

## 2022-12-31 DIAGNOSIS — F17200 Nicotine dependence, unspecified, uncomplicated: Secondary | ICD-10-CM

## 2023-01-21 ENCOUNTER — Ambulatory Visit (HOSPITAL_COMMUNITY)
Admission: RE | Admit: 2023-01-21 | Discharge: 2023-01-21 | Disposition: A | Discharge status: Home / Self Care | Payer: PRIVATE HEALTH INSURANCE | Source: Ambulatory Visit | Attending: Cardiology | Admitting: Cardiology

## 2023-01-21 DIAGNOSIS — F17200 Nicotine dependence, unspecified, uncomplicated: Secondary | ICD-10-CM | POA: Insufficient documentation

## 2023-01-21 DIAGNOSIS — E7800 Pure hypercholesterolemia, unspecified: Secondary | ICD-10-CM | POA: Insufficient documentation

## 2023-01-21 NOTE — Progress Notes (Signed)
Coronary calcium score 01/21/2023: Coronary calcium score of 0.  [place] database percentile 0-1 percentile.

## 2023-02-11 ENCOUNTER — Ambulatory Visit: Payer: Self-pay | Admitting: Cardiology

## 2023-06-17 ENCOUNTER — Ambulatory Visit: Payer: PRIVATE HEALTH INSURANCE | Admitting: Cardiology

## 2023-08-15 ENCOUNTER — Ambulatory Visit: Payer: PRIVATE HEALTH INSURANCE | Admitting: Cardiology
